# Patient Record
Sex: Female | Born: 2000 | Race: Black or African American | Hispanic: No | Marital: Single | Smoking: Current some day smoker
Health system: Southern US, Community
[De-identification: ages and names within clinical notes are randomized; demographics above are authoritative.]

## PROBLEM LIST (undated history)

## (undated) DIAGNOSIS — E282 Polycystic ovarian syndrome: Secondary | ICD-10-CM

## (undated) DIAGNOSIS — F32A Depression, unspecified: Secondary | ICD-10-CM

## (undated) DIAGNOSIS — R519 Headache, unspecified: Secondary | ICD-10-CM

## (undated) DIAGNOSIS — F419 Anxiety disorder, unspecified: Secondary | ICD-10-CM

## (undated) DIAGNOSIS — R51 Headache: Secondary | ICD-10-CM

## (undated) HISTORY — PX: ADENOIDECTOMY: SHX5191

## (undated) HISTORY — DX: Headache, unspecified: R51.9

## (undated) HISTORY — DX: Anxiety disorder, unspecified: F41.9

## (undated) HISTORY — PX: TONSILLECTOMY: SUR1361

## (undated) HISTORY — DX: Depression, unspecified: F32.A

## (undated) HISTORY — DX: Headache: R51

---
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
## 2022-01-06 ENCOUNTER — Encounter: Payer: Self-pay | Admitting: *Deleted

## 2022-01-20 NOTE — L&D Delivery Note (Signed)
OB/GYN Faculty Practice Delivery Note  Alyssa Mcdaniel is a 21 y.o. G2P0101 s/p SVD at [redacted]w[redacted]d. She was admitted for SOL.   ROM: 4h 15m with meconium stained fluid (moderate) GBS Status: Negative Maximum Maternal Temperature: 98.2  Labor Progress: Pt arrived in active labor and progressed without augmentation to complete. Did require careful expectant management due to recurrent decelerations once her water broke. Delivery call to request NICU at bedside for delivery.  Delivery Date/Time: 05/31/22 at 1321 Delivery: Called to room and patient was complete with strong urge to push. Head delivered LOP. Nuchal cord and anterior hand stuck behind fetal back. Shoulder and body delivered in usual fashion. Infant suctioned then had spontaneous cry, placed on mother's abdomen, dried and stimulated. NICU team able to exit. Cord clamped x 2 after 3-minute delay, and cut by [name]. Cord blood collected but clotted before it could be drawn into tubes. Cord sample collected. Placenta delivered spontaneously, intact, with 3-vessel cord. Fundus firm with massage and Pitocin. Labia, perineum, vagina, and cervix inspected, bilateral labial lacerations found and repaired with 3.0 vicryl.   Placenta: spontaneous, intact sent to L&D for disposal Complications: none Lacerations: bilateral labial EBL: 307 Analgesia: epidural  Postpartum Planning [x]  transfer orders to MB [x]  discharge summary started & shared [x]  message to sent to schedule follow-up  [x]  lists updated [x]  vaccines UTD  Infant: Boy(yes)  APGARs 9/9  [id number]  Edd Arbour, CNM, IBCLC Certified Nurse Midwife, [facility] for [employer], [facility] 05/31/2022, 2:57 PM

## 2022-01-21 ENCOUNTER — Ambulatory Visit: Payer: Medicaid Other | Admitting: *Deleted

## 2022-01-21 ENCOUNTER — Ambulatory Visit: Payer: Medicaid Other | Attending: Obstetrics and Gynecology

## 2022-01-21 ENCOUNTER — Encounter: Payer: Self-pay | Admitting: *Deleted

## 2022-01-21 ENCOUNTER — Other Ambulatory Visit: Payer: Self-pay | Admitting: *Deleted

## 2022-01-21 VITALS — BP 115/61 | HR 93

## 2022-01-21 DIAGNOSIS — Z98891 History of uterine scar from previous surgery: Secondary | ICD-10-CM

## 2022-01-21 DIAGNOSIS — O09892 Supervision of other high risk pregnancies, second trimester: Secondary | ICD-10-CM

## 2022-01-21 DIAGNOSIS — O99212 Obesity complicating pregnancy, second trimester: Secondary | ICD-10-CM | POA: Diagnosis not present

## 2022-01-21 DIAGNOSIS — Z8751 Personal history of pre-term labor: Secondary | ICD-10-CM | POA: Diagnosis present

## 2022-01-21 DIAGNOSIS — O0990 Supervision of high risk pregnancy, unspecified, unspecified trimester: Secondary | ICD-10-CM | POA: Diagnosis present

## 2022-01-21 DIAGNOSIS — E669 Obesity, unspecified: Secondary | ICD-10-CM

## 2022-01-21 DIAGNOSIS — O09212 Supervision of pregnancy with history of pre-term labor, second trimester: Secondary | ICD-10-CM

## 2022-01-21 DIAGNOSIS — O34219 Maternal care for unspecified type scar from previous cesarean delivery: Secondary | ICD-10-CM

## 2022-01-21 DIAGNOSIS — Z3A19 19 weeks gestation of pregnancy: Secondary | ICD-10-CM

## 2022-01-21 DIAGNOSIS — Z363 Encounter for antenatal screening for malformations: Secondary | ICD-10-CM

## 2022-01-31 ENCOUNTER — Telehealth (INDEPENDENT_AMBULATORY_CARE_PROVIDER_SITE_OTHER): Payer: Medicaid Other | Admitting: Medical

## 2022-01-31 ENCOUNTER — Encounter: Payer: Self-pay | Admitting: Medical

## 2022-01-31 VITALS — Wt 215.0 lb

## 2022-01-31 DIAGNOSIS — Z8751 Personal history of pre-term labor: Secondary | ICD-10-CM

## 2022-01-31 DIAGNOSIS — Z3A21 21 weeks gestation of pregnancy: Secondary | ICD-10-CM

## 2022-01-31 DIAGNOSIS — O418X10 Other specified disorders of amniotic fluid and membranes, first trimester, not applicable or unspecified: Secondary | ICD-10-CM

## 2022-01-31 DIAGNOSIS — O34219 Maternal care for unspecified type scar from previous cesarean delivery: Secondary | ICD-10-CM

## 2022-01-31 DIAGNOSIS — O0990 Supervision of high risk pregnancy, unspecified, unspecified trimester: Secondary | ICD-10-CM

## 2022-01-31 DIAGNOSIS — O09212 Supervision of pregnancy with history of pre-term labor, second trimester: Secondary | ICD-10-CM

## 2022-01-31 DIAGNOSIS — Z98891 History of uterine scar from previous surgery: Secondary | ICD-10-CM

## 2022-01-31 DIAGNOSIS — O418X20 Other specified disorders of amniotic fluid and membranes, second trimester, not applicable or unspecified: Secondary | ICD-10-CM

## 2022-01-31 NOTE — Progress Notes (Signed)
I connected with Alyssa Mcdaniel 01/31/22 at 11:15 AM EST by: MyChart video and verified that I am speaking with the correct person using two identifiers.  Patient is located at home and provider is located at [facility].     I discussed the limitations, risks, security and privacy concerns of performing an evaluation and management service by MyChart video and the availability of in person appointments. I also discussed with the patient that there may be a patient responsible charge related to this service. By engaging in this virtual visit, you consent to the provision of healthcare.  Additionally, you authorize for your insurance to be billed for the services provided during this visit.  The patient expressed understanding and agreed to proceed.  The following staff members participated in the virtual visit:  Madalyn Rob, RN     PRENATAL VISIT NOTE  Subjective:  Alyssa Mcdaniel is a 22 y.o. G2P0101 at [redacted]w[redacted]d  for virtual visit for ongoing prenatal care.  She is currently monitored for the following issues for this high-risk pregnancy and has Migraine without aura and without status migrainosus, not intractable; Depression; Asthma; Subchorionic hematoma in first trimester; Supervision of high risk pregnancy, antepartum; History of cesarean section; and history of 27 weeks delivery on their problem list.  Patient reports headache and URI symptoms x 3 days, intermittent round ligament pain .  Contractions: Not present. Vag. Bleeding: None.  Movement: Present. Denies leaking of fluid.   The following portions of the patient's history were reviewed and updated as appropriate: allergies, current medications, past family history, past medical history, past social history, past surgical history and problem list.   Objective:   Vitals:   01/31/22 1112  Weight: 215 lb (97.5 kg)   Self-Obtained  Fetal Status:     Movement: Present     Assessment and Plan:  Pregnancy: G2P0101 at [redacted]w[redacted]d 1. Supervision of  high risk pregnancy, antepartum - Repeat US 02/18/22  2. History of cesarean section - Desires TOLAC, consent at 28 weeks   3. Subchorionic hematoma in first trimester, single or unspecified fetus  4. history of 27 weeks delivery - With twin pregnancy  5. Round ligament pain   6. Viral upper respiratory infection   - List of OTC medications given, advised rest and hydration  - Discussed warning signs for worsening condition that would warrant sooner follow-up or Antibiotics   7. [redacted] weeks gestation of pregnancy  Preterm labor symptoms and general obstetric precautions including but not limited to vaginal bleeding, contractions, leaking of fluid and fetal movement were reviewed in detail with the patient.  Return in about 4 weeks (around 02/28/2022) for LOB, In-Person, any provider.  Future Appointments  Date Time Provider [place]  02/18/2022  1:15 PM [facility] NURSE [facility]  02/18/2022  1:30 PM [facility] US2 [facility] [place]     Time spent on virtual visit: 10 minutes  Kerry Hough, PA-C

## 2022-01-31 NOTE — Patient Instructions (Signed)

## 2022-02-09 ENCOUNTER — Encounter (HOSPITAL_COMMUNITY): Payer: Self-pay | Admitting: Family Medicine

## 2022-02-09 ENCOUNTER — Inpatient Hospital Stay (HOSPITAL_COMMUNITY)
Admission: AD | Admit: 2022-02-09 | Discharge: 2022-02-09 | Disposition: A | Discharge status: Home / Self Care | Payer: Medicaid Other | Attending: Family Medicine | Admitting: Family Medicine

## 2022-02-09 DIAGNOSIS — O26899 Other specified pregnancy related conditions, unspecified trimester: Secondary | ICD-10-CM

## 2022-02-09 DIAGNOSIS — Z3A22 22 weeks gestation of pregnancy: Secondary | ICD-10-CM

## 2022-02-09 DIAGNOSIS — O09899 Supervision of other high risk pregnancies, unspecified trimester: Secondary | ICD-10-CM

## 2022-02-09 DIAGNOSIS — O26892 Other specified pregnancy related conditions, second trimester: Secondary | ICD-10-CM | POA: Diagnosis not present

## 2022-02-09 DIAGNOSIS — O09212 Supervision of pregnancy with history of pre-term labor, second trimester: Secondary | ICD-10-CM | POA: Diagnosis not present

## 2022-02-09 DIAGNOSIS — R102 Pelvic and perineal pain: Secondary | ICD-10-CM | POA: Diagnosis not present

## 2022-02-09 DIAGNOSIS — O99891 Other specified diseases and conditions complicating pregnancy: Secondary | ICD-10-CM | POA: Insufficient documentation

## 2022-02-09 LAB — URINALYSIS, ROUTINE W REFLEX MICROSCOPIC
Bilirubin Urine: NEGATIVE
Glucose, UA: NEGATIVE mg/dL
Hgb urine dipstick: NEGATIVE
Ketones, ur: NEGATIVE mg/dL
Leukocytes,Ua: NEGATIVE
Nitrite: NEGATIVE
Protein, ur: NEGATIVE mg/dL
Specific Gravity, Urine: 1.01 (ref 1.005–1.030)
pH: 7 (ref 5.0–8.0)

## 2022-02-09 LAB — WET PREP, GENITAL
Clue Cells Wet Prep HPF POC: NONE SEEN
Sperm: NONE SEEN
Trich, Wet Prep: NONE SEEN
WBC, Wet Prep HPF POC: <10 (ref <10)
Yeast Wet Prep HPF POC: NONE SEEN

## 2022-02-09 NOTE — Discharge Instructions (Signed)

## 2022-02-09 NOTE — MAU Provider Note (Signed)
History     CSN: [id number]  Arrival date and time: 02/09/22 2725   Event Date/Time   First Provider Initiated Contact with Patient 02/09/22 2104      Chief Complaint  Patient presents with   Abdominal Pain   Hip Pain   HPI  Alyssa Mcdaniel is a 22 y.o. G2P0101 at [redacted]w[redacted]d who presents for evaluation of left lower abdominal pain. Patient reports it has resolved at this time. When it occurred, she reports it lasted for longer than round ligament pain usually does. She states she went and had a bowel movement which decreased the severity of the pain but it was still present. She states the pain resolved without medication. She denies any pain at this time.  She denies any vaginal bleeding, discharge, and leaking of fluid. Denies any constipation, diarrhea or any urinary complaints. Reports normal fetal movement.  She is understandably nervous because she feels like these are similar symptoms to what she had before she delivered her twins.    OB History     Gravida  2   Para  1   Term  0   Preterm  1   AB  0   Living  1      SAB  0   IAB      Ectopic  0   Multiple  1   Live Births  2        Obstetric Comments  Twin A delivered @ Home          Past Medical History:  Diagnosis Date   Asthma    Depression    Headache    PCOS (polycystic ovarian syndrome)    Sleeping difficulty 02/24/2018   Superficial thrombophlebitis in puerperium 03/15/2021   Tension headache 02/24/2018    Past Surgical History:  Procedure Laterality Date   ADENOIDECTOMY     CESAREAN SECTION MULTI-GESTATIONAL N/A 03/10/2021   Procedure: CESAREAN SECTION MULTI-GESTATIONAL;  Surgeon: Tereso Newcomer, MD;  Location: [facility];  Service: Obstetrics;  Laterality: N/A;   TONSILLECTOMY      Family History  Problem Relation Age of Onset   Hypertension Mother    Anxiety disorder Mother    Depression Mother    Hypertension Maternal Aunt    Anxiety disorder Maternal Grandmother     Depression Maternal Grandmother    Breast cancer Maternal Grandmother 65   Anxiety disorder Maternal Grandfather    Depression Maternal Grandfather    Diabetes Other    Cancer Other    Hypertension Other    Migraines Neg Hx    Seizures Neg Hx    Autism Neg Hx    ADD / ADHD Neg Hx    Bipolar disorder Neg Hx    Schizophrenia Neg Hx     Social History   Tobacco Use   Smoking status: Never    Passive exposure: Yes   Smokeless tobacco: Never  Vaping Use   Vaping Use: Never used  Substance Use Topics   Alcohol use: No   Drug use: Not Currently    Types: Marijuana    Comment: occasional - last smoked 11/03/21    Allergies: No Known Allergies  Medications Prior to Admission  Medication Sig Dispense Refill Last Dose   aspirin EC 81 MG tablet Take 1 tablet (81 mg total) by mouth daily. Take after 12 weeks for prevention of preeclampsia later in pregnancy 300 tablet 2 02/08/2022   Pediatric Multivit-Minerals (FLINTSTONES GUMMIES PO) Take 2 tablets by  mouth daily.   02/09/2022   sertraline (ZOLOFT) 50 MG tablet Take 1 tablet (50 mg total) by mouth daily. (Patient taking differently: Take 50 mg by mouth at bedtime. 10pm) 90 tablet 3 02/08/2022   albuterol (VENTOLIN HFA) 108 (90 Base) MCG/ACT inhaler Inhale 2 puffs into the lungs every 6 (six) hours as needed for shortness of breath. For shortness of breath 18 g 3    Blood Pressure Monitoring DEVI 1 each by Does not apply route once a week. 1 each 0    famotidine (PEPCID) 20 MG tablet Take 20 mg by mouth 2 (two) times daily.      fluticasone (FLOVENT HFA) 110 MCG/ACT inhaler Inhale 2 puffs into the lungs in the morning and at bedtime. 1 each 12    metoCLOPramide (REGLAN) 10 MG tablet Take 1 tablet (10 mg total) by mouth every 8 (eight) hours as needed for nausea or vomiting. 30 tablet 0    ondansetron (ZOFRAN-ODT) 8 MG disintegrating tablet Take 8 mg by mouth every 8 (eight) hours as needed for nausea or vomiting.      terconazole (TERAZOL  7) 0.4 % vaginal cream Place 1 applicator vaginally at bedtime. (Patient not taking: Reported on 01/31/2022) 45 g 0     Review of Systems  Constitutional: Negative.  Negative for fatigue and fever.  HENT: Negative.    Respiratory: Negative.  Negative for shortness of breath.   Cardiovascular: Negative.  Negative for chest pain.  Gastrointestinal:  Positive for abdominal pain. Negative for constipation, diarrhea, nausea and vomiting.  Genitourinary: Negative.  Negative for dysuria, vaginal bleeding and vaginal discharge.  Neurological: Negative.  Negative for dizziness and headaches.   Physical Exam   Blood pressure 122/61, pulse 79, temperature 98.7 F (37.1 C), temperature source Oral, resp. rate 18, height 5\' 4"  (1.626 m), weight 95.1 kg, last menstrual period 08/20/2021, SpO2 100 %, currently breastfeeding.  Patient Vitals for the past 24 hrs:  BP Temp Temp src Pulse Resp SpO2 Height Weight  02/09/22 2156 122/61 -- -- 79 18 100 % -- --  02/09/22 2035 117/60 -- -- 94 18 96 % -- --  02/09/22 1941 118/61 98.7 F (37.1 C) Oral (!) 102 18 100 % 5\' 4"  (1.626 m) 95.1 kg    Physical Exam Vitals and nursing note reviewed.  Constitutional:      General: She is not in acute distress.    Appearance: She is well-developed.  HENT:     Head: Normocephalic.  Eyes:     Pupils: Pupils are equal, round, and reactive to light.  Cardiovascular:     Rate and Rhythm: Normal rate and regular rhythm.     Heart sounds: Normal heart sounds.  Pulmonary:     Effort: Pulmonary effort is normal. No respiratory distress.     Breath sounds: Normal breath sounds.  Abdominal:     General: Bowel sounds are normal. There is no distension.     Palpations: Abdomen is soft.     Tenderness: There is no abdominal tenderness.  Skin:    General: Skin is warm and dry.  Neurological:     Mental Status: She is alert and oriented to person, place, and time.  Psychiatric:        Mood and Affect: Mood normal.         Behavior: Behavior normal.        Thought Content: Thought content normal.        Judgment: Judgment normal.  FHT: 148 bpm  Dilation: Closed Effacement (%): Thick Exam by:: Sharolyn Douglas, CNM   MAU Course  Procedures  Results for orders placed or performed during the hospital encounter of 02/09/22 (from the past 24 hour(s))  Urinalysis, Routine w reflex microscopic     Status: None   Collection Time: 02/09/22  7:42 PM  Result Value Ref Range   Color, Urine YELLOW YELLOW   APPearance CLEAR CLEAR   Specific Gravity, Urine 1.010 1.005 - 1.030   pH 7.0 5.0 - 8.0   Glucose, UA NEGATIVE NEGATIVE mg/dL   Hgb urine dipstick NEGATIVE NEGATIVE   Bilirubin Urine NEGATIVE NEGATIVE   Ketones, ur NEGATIVE NEGATIVE mg/dL   Protein, ur NEGATIVE NEGATIVE mg/dL   Nitrite NEGATIVE NEGATIVE   Leukocytes,Ua NEGATIVE NEGATIVE  Wet prep, genital     Status: None   Collection Time: 02/09/22  9:24 PM  Result Value Ref Range   Yeast Wet Prep HPF POC NONE SEEN NONE SEEN   Trich, Wet Prep NONE SEEN NONE SEEN   Clue Cells Wet Prep HPF POC NONE SEEN NONE SEEN   WBC, Wet Prep HPF POC <10 <10   Sperm NONE SEEN      MDM Labs ordered and reviewed.   UA Wet prep and gc/chlamydia  Assessment and Plan   1. Pain of round ligament affecting pregnancy, antepartum   2. [redacted] weeks gestation of pregnancy   3. Hx of preterm delivery, currently pregnant     -Discharge home in stable condition -Abdominal pain precautions discussed -Patient advised to follow-up with OB as scheduled this week for prenatal care -Patient may return to MAU as needed or if her condition were to change or worsen  Wende Mott, CNM 02/09/2022, 9:04 PM

## 2022-02-09 NOTE — MAU Note (Signed)
.  Alyssa Mcdaniel is a 22 y.o. at [redacted]w[redacted]d here in MAU reporting: round ligament pain on the left side that radiated down to her leg and buttocks that has been ongoing but today it got worse. Pt reports she was not able to get comfortable and was not more active today, and it was more intense prior. Pt denies taking any pain meds. Pt is very concern due to previous experience with preterm labor and del. Pt denies VB, LOF, PIH s/s.  Last intercourse yesterday   Onset of complaint:  Pain score: 4/10 Vitals:   02/09/22 1941  BP: 118/61  Pulse: (!) 102  Resp: 18  Temp: 98.7 F (37.1 C)  SpO2: 100%     FHT:148 Lab orders placed from triage:  ua

## 2022-02-10 LAB — GC/CHLAMYDIA PROBE AMP (~~LOC~~) NOT AT ARMC
Chlamydia: NEGATIVE
Comment: NEGATIVE
Comment: NORMAL
Neisseria Gonorrhea: NEGATIVE

## 2022-02-18 ENCOUNTER — Ambulatory Visit: Payer: Medicaid Other | Admitting: *Deleted

## 2022-02-18 ENCOUNTER — Other Ambulatory Visit: Payer: Self-pay

## 2022-02-18 ENCOUNTER — Encounter: Payer: Self-pay | Admitting: *Deleted

## 2022-02-18 ENCOUNTER — Ambulatory Visit: Payer: Medicaid Other | Attending: Maternal & Fetal Medicine

## 2022-02-18 VITALS — BP 128/61 | HR 83

## 2022-02-18 DIAGNOSIS — Z8751 Personal history of pre-term labor: Secondary | ICD-10-CM

## 2022-02-18 DIAGNOSIS — Z98891 History of uterine scar from previous surgery: Secondary | ICD-10-CM

## 2022-02-18 DIAGNOSIS — Z362 Encounter for other antenatal screening follow-up: Secondary | ICD-10-CM | POA: Diagnosis not present

## 2022-02-18 DIAGNOSIS — O99212 Obesity complicating pregnancy, second trimester: Secondary | ICD-10-CM | POA: Insufficient documentation

## 2022-02-18 DIAGNOSIS — E669 Obesity, unspecified: Secondary | ICD-10-CM

## 2022-02-18 DIAGNOSIS — O34219 Maternal care for unspecified type scar from previous cesarean delivery: Secondary | ICD-10-CM | POA: Insufficient documentation

## 2022-02-18 DIAGNOSIS — O09892 Supervision of other high risk pregnancies, second trimester: Secondary | ICD-10-CM | POA: Diagnosis present

## 2022-02-18 DIAGNOSIS — O0990 Supervision of high risk pregnancy, unspecified, unspecified trimester: Secondary | ICD-10-CM

## 2022-02-18 DIAGNOSIS — O09212 Supervision of pregnancy with history of pre-term labor, second trimester: Secondary | ICD-10-CM | POA: Diagnosis not present

## 2022-02-18 DIAGNOSIS — Z3A23 23 weeks gestation of pregnancy: Secondary | ICD-10-CM

## 2022-03-03 ENCOUNTER — Encounter (HOSPITAL_COMMUNITY): Payer: Self-pay | Admitting: Family Medicine

## 2022-03-03 ENCOUNTER — Inpatient Hospital Stay (HOSPITAL_COMMUNITY)
Admission: AD | Admit: 2022-03-03 | Discharge: 2022-03-03 | Disposition: A | Discharge status: Home / Self Care | Payer: Medicaid Other | Attending: Family Medicine | Admitting: Family Medicine

## 2022-03-03 DIAGNOSIS — Z3689 Encounter for other specified antenatal screening: Secondary | ICD-10-CM

## 2022-03-03 DIAGNOSIS — R109 Unspecified abdominal pain: Secondary | ICD-10-CM | POA: Diagnosis not present

## 2022-03-03 DIAGNOSIS — Z98891 History of uterine scar from previous surgery: Secondary | ICD-10-CM

## 2022-03-03 DIAGNOSIS — N898 Other specified noninflammatory disorders of vagina: Secondary | ICD-10-CM

## 2022-03-03 DIAGNOSIS — Z8751 Personal history of pre-term labor: Secondary | ICD-10-CM

## 2022-03-03 DIAGNOSIS — Z3A25 25 weeks gestation of pregnancy: Secondary | ICD-10-CM | POA: Diagnosis not present

## 2022-03-03 DIAGNOSIS — O0990 Supervision of high risk pregnancy, unspecified, unspecified trimester: Secondary | ICD-10-CM

## 2022-03-03 DIAGNOSIS — O418X10 Other specified disorders of amniotic fluid and membranes, first trimester, not applicable or unspecified: Secondary | ICD-10-CM

## 2022-03-03 DIAGNOSIS — O26892 Other specified pregnancy related conditions, second trimester: Secondary | ICD-10-CM | POA: Diagnosis not present

## 2022-03-03 DIAGNOSIS — N949 Unspecified condition associated with female genital organs and menstrual cycle: Secondary | ICD-10-CM | POA: Diagnosis not present

## 2022-03-03 LAB — URINALYSIS, ROUTINE W REFLEX MICROSCOPIC
Bilirubin Urine: NEGATIVE
Glucose, UA: NEGATIVE mg/dL
Hgb urine dipstick: NEGATIVE
Ketones, ur: NEGATIVE mg/dL
Leukocytes,Ua: NEGATIVE
Nitrite: NEGATIVE
Protein, ur: NEGATIVE mg/dL
Specific Gravity, Urine: 1.013 (ref 1.005–1.030)
pH: 7 (ref 5.0–8.0)

## 2022-03-03 LAB — WET PREP, GENITAL
Clue Cells Wet Prep HPF POC: NONE SEEN
Sperm: NONE SEEN
Trich, Wet Prep: NONE SEEN
WBC, Wet Prep HPF POC: <10 (ref <10)
Yeast Wet Prep HPF POC: NONE SEEN

## 2022-03-03 NOTE — MAU Provider Note (Signed)
History     CSN: [id number]  Arrival date and time: 03/03/22 1532   Event Date/Time   First Provider Initiated Contact with Patient 03/03/22 1610      Chief Complaint  Patient presents with   Vaginal Discharge   HPI  Alyssa Mcdaniel is a 22 year old G2P0101 at 50w5dgestation presenting with abdominal pain and one episode of mucus discharge. States she felt cramping on an off all night that resolved around 9AM. States she had one instance of yellow-green discharge around 12PM. Denies further episodes of abdominal pain or discharge. Denies VB, LOF, dysuria. Reports good FM. Last BM today. Relates she is concerned because she has a prior preterm pregnancy around this gestation.  Recent testing for STI and wet prep on 02/09/2022 negative.   OB History     Gravida  2   Para  1   Term  0   Preterm  1   AB  0   Living  1      SAB  0   IAB      Ectopic  0   Multiple  1   Live Births  2        Obstetric Comments  Twin A delivered @ Home          Past Medical History:  Diagnosis Date   Asthma    Depression    Headache    PCOS (polycystic ovarian syndrome)    Sleeping difficulty 02/24/2018   Superficial thrombophlebitis in puerperium 03/15/2021   Tension headache 02/24/2018    Past Surgical History:  Procedure Laterality Date   ADENOIDECTOMY     CESAREAN SECTION MULTI-GESTATIONAL N/A 03/10/2021   Procedure: CESAREAN SECTION MULTI-GESTATIONAL;  Surgeon: AOsborne Oman MD;  Location: [facility] LD [facility]S;  Service: Obstetrics;  Laterality: N/A;   TONSILLECTOMY      Family History  Problem Relation Age of Onset   Hypertension Mother    Anxiety disorder Mother    Depression Mother    Hypertension Maternal Aunt    Anxiety disorder Maternal Grandmother    Depression Maternal Grandmother    Breast cancer Maternal Grandmother 424  Anxiety disorder Maternal Grandfather    Depression Maternal Grandfather    Diabetes Other    Cancer Other    Hypertension Other     Migraines Neg Hx    Seizures Neg Hx    Autism Neg Hx    ADD / ADHD Neg Hx    Bipolar disorder Neg Hx    Schizophrenia Neg Hx     Social History   Tobacco Use   Smoking status: Never    Passive exposure: Yes   Smokeless tobacco: Never  Vaping Use   Vaping Use: Never used  Substance Use Topics   Alcohol use: No   Drug use: Not Currently    Types: Marijuana    Comment: occasional - last smoked 11/03/21    Allergies: No Known Allergies  Medications Prior to Admission  Medication Sig Dispense Refill Last Dose   aspirin EC 81 MG tablet Take 1 tablet (81 mg total) by mouth daily. Take after 12 weeks for prevention of preeclampsia later in pregnancy 300 tablet 2 03/02/2022   famotidine (PEPCID) 20 MG tablet Take 20 mg by mouth 2 (two) times daily.   Past Week   Pediatric Multivit-Minerals (FLINTSTONES GUMMIES PO) Take 2 tablets by mouth daily.   03/03/2022   sertraline (ZOLOFT) 50 MG tablet Take 1 tablet (50 mg total) by  mouth daily. (Patient taking differently: Take 50 mg by mouth at bedtime. 10pm) 90 tablet 3 03/03/2022   albuterol (VENTOLIN HFA) 108 (90 Base) MCG/ACT inhaler Inhale 2 puffs into the lungs every 6 (six) hours as needed for shortness of breath. For shortness of breath 18 g 3 More than a month   Blood Pressure Monitoring DEVI 1 each by Does not apply route once a week. 1 each 0    fluticasone (FLOVENT HFA) 110 MCG/ACT inhaler Inhale 2 puffs into the lungs in the morning and at bedtime. 1 each 12    metoCLOPramide (REGLAN) 10 MG tablet Take 1 tablet (10 mg total) by mouth every 8 (eight) hours as needed for nausea or vomiting. (Patient not taking: Reported on 03/03/2022) 30 tablet 0 Not Taking   ondansetron (ZOFRAN-ODT) 8 MG disintegrating tablet Take 8 mg by mouth every 8 (eight) hours as needed for nausea or vomiting.      terconazole (TERAZOL 7) 0.4 % vaginal cream Place 1 applicator vaginally at bedtime. (Patient not taking: Reported on 01/31/2022) 45 g 0     Review of  Systems General: Negative for fever and fatigue. CV: Negative for chest pain Resp: Negative for SOB GU: Positive for vaginal discharge  Physical Exam   Blood pressure 94/79, pulse 94, temperature 98.7 F (37.1 C), resp. rate 18, last menstrual period 08/20/2021, currently breastfeeding.  Physical Exam General: Well-appearing. Alert. NAD HEENT: Normocephalic. White sclera. No rhinorrhea or congestion CV: RRR without murmur Pulm: CTAB. Normal WOB on RA. No wheezing Abdomen: Gravid, non-tender, non-distended. Ext: Well perfused. No peripheral edema Skin: Warm, dry.  MAU Course  Procedures  MDM 1630: Abdominal pain most likely round ligament pain now has resolved. STI and wet prep ordered.  1800: Wet prep negative. Fetal monitoring reassuring. Vaginal discharge most likely physiologic. Discussed return precautions with patient and will follow up with G/C results if positive.  Fetal Monitoring: Baseline: 145 Variability: Moderate Accelerations: 10x10 Decelerations: None Contractions: None  Assessment and Plan   Normal [redacted] week gestation pregnancy Vaginal discharge Abdominal pain, resolved  Discharge home with return precautions F/u G/C results if positive   Colletta [place] 03/03/2022, 4:29 PM

## 2022-03-03 NOTE — Discharge Instructions (Signed)

## 2022-03-03 NOTE — MAU Note (Signed)
.  Alyssa Mcdaniel is a 22 y.o. at [redacted]w[redacted]d here in MAU reporting: she was having cramping in her back and lower pelvis off an on last night. Cramping has stopped but when she went to BR today and she wiped she had a yeloow-clear mucus that came out. No gushes of fluid or bleeding . Good fetal movement felt. No cramping today.  Onset of complaint: last night Pain score: 0 Vitals:   03/03/22 1555  BP: 94/79  Pulse: 94  Resp: 18  Temp: 98.7 F (37.1 C)     FHT:150 Lab orders placed from triage:  U/A

## 2022-03-04 LAB — GC/CHLAMYDIA PROBE AMP (~~LOC~~) NOT AT ARMC
Chlamydia: NEGATIVE
Comment: NEGATIVE
Comment: NORMAL
Neisseria Gonorrhea: NEGATIVE

## 2022-03-07 ENCOUNTER — Encounter: Payer: Medicaid Other | Admitting: Obstetrics & Gynecology

## 2022-03-17 ENCOUNTER — Ambulatory Visit (INDEPENDENT_AMBULATORY_CARE_PROVIDER_SITE_OTHER): Payer: Medicaid Other | Admitting: Advanced Practice Midwife

## 2022-03-17 ENCOUNTER — Other Ambulatory Visit: Payer: Self-pay

## 2022-03-17 VITALS — BP 110/66 | HR 82 | Wt 215.6 lb

## 2022-03-17 DIAGNOSIS — Z3A27 27 weeks gestation of pregnancy: Secondary | ICD-10-CM

## 2022-03-17 DIAGNOSIS — O0990 Supervision of high risk pregnancy, unspecified, unspecified trimester: Secondary | ICD-10-CM

## 2022-03-17 DIAGNOSIS — O0993 Supervision of high risk pregnancy, unspecified, third trimester: Secondary | ICD-10-CM

## 2022-03-17 DIAGNOSIS — Z8751 Personal history of pre-term labor: Secondary | ICD-10-CM

## 2022-03-17 DIAGNOSIS — O0992 Supervision of high risk pregnancy, unspecified, second trimester: Secondary | ICD-10-CM

## 2022-03-17 DIAGNOSIS — F5089 Other specified eating disorder: Secondary | ICD-10-CM

## 2022-03-17 DIAGNOSIS — O99013 Anemia complicating pregnancy, third trimester: Secondary | ICD-10-CM

## 2022-03-17 DIAGNOSIS — Z98891 History of uterine scar from previous surgery: Secondary | ICD-10-CM

## 2022-03-17 MED ORDER — PRENATAL VITAMINS 28-0.8 MG PO TABS: 1.0000 | ORAL_TABLET | Freq: Every day | ORAL | 10 refills | Status: DC

## 2022-03-17 NOTE — Progress Notes (Signed)
Pt reports craving & eating alot of cornstarch lately.

## 2022-03-17 NOTE — Progress Notes (Signed)
   PRENATAL VISIT NOTE  Subjective:  Alyssa Mcdaniel is a 22 y.o. G2P0101 at 23w5dbeing seen today for ongoing prenatal care.  She is currently monitored for the following issues for this high-risk pregnancy and has Migraine without aura and without status migrainosus, not intractable; Depression; Asthma; Subchorionic hematoma in first trimester; Supervision of high risk pregnancy, antepartum; History of cesarean section; and history of 27 weeks delivery on their problem list.  Patient reports no complaints.  Contractions: Not present. Vag. Bleeding: None.  Movement: Present. Denies leaking of fluid.   The following portions of the patient's history were reviewed and updated as appropriate: allergies, current medications, past family history, past medical history, past social history, past surgical history and problem list.   Objective:   Vitals:   03/17/22 1329  BP: 110/66  Pulse: 82  Weight: 215 lb 9.6 oz (97.8 kg)    Fetal Status: Fetal Heart Rate (bpm): 147 Fundal Height: 27 cm Movement: Present     General:  Alert, oriented and cooperative. Patient is in no acute distress.  Skin: Skin is warm and dry. No rash noted.   Cardiovascular: Normal heart rate noted  Respiratory: Normal respiratory effort, no problems with respiration noted  Abdomen: Soft, gravid, appropriate for gestational age.  Pain/Pressure: Present     Pelvic: Cervical exam deferred        Extremities: Normal range of motion.  Edema: None  Mental Status: Normal mood and affect. Normal behavior. Normal judgment and thought content.   Assessment and Plan:  Pregnancy: G2P0101 at 226w5d. Supervision of high risk pregnancy, antepartum --Anticipatory guidance about next visits/weeks of pregnancy given.   - CBC; Future - HIV Antibody (routine testing w rflx); Future - RPR; Future - Glucose Tolerance, 2 Hours w/1 Hour; Future  2. History of cesarean section --Desires VBAC, to see MD at next appt for VBAC  consent --Hx cesarean at 27 weeks for twins pregnancy for abruption/malpresentation  3. history of 27 weeks delivery   4. Pica --Craving cornstarch. Mostly the texture so puts in her mouth but spits it out after chewing.   --Hgb 10.7 on 12/02/21 --Discussed likely anemia, pt will switch from Flintstones to PNV with iron today, increase iron rich foods in diet, 28 week labs ordered --Pt denies any cravings for nonfoods or other unsafe substances.  Pt to notify office right away if she craves anything not safe to eat.   5. [redacted] weeks gestation of pregnancy    Preterm labor symptoms and general obstetric precautions including but not limited to vaginal bleeding, contractions, leaking of fluid and fetal movement were reviewed in detail with the patient. Please refer to After Visit Summary for other counseling recommendations.   Return in about 2 weeks (around 03/31/2022) for MD only, VBAC consent.  Future Appointments  Date Time Provider [place]/04/2022  8:20 AM [facility]/07/2022  1:15 PM [facility] NURSE [facility]/07/2022  1:30 PM [facility] US3 [facility]3/14/2024 10:55 AM PiAletha HalimMD [facility]  LiFatima BlankCNM

## 2022-03-24 ENCOUNTER — Other Ambulatory Visit: Payer: Self-pay

## 2022-03-24 ENCOUNTER — Other Ambulatory Visit: Payer: Medicaid Other

## 2022-03-24 DIAGNOSIS — O0990 Supervision of high risk pregnancy, unspecified, unspecified trimester: Secondary | ICD-10-CM

## 2022-03-25 LAB — CBC
Hematocrit: 30.7 % — ABNORMAL LOW (ref 34.0–46.6)
Hemoglobin: 10 g/dL — ABNORMAL LOW (ref 11.1–15.9)
MCH: 27.8 pg (ref 26.6–33.0)
MCHC: 32.6 g/dL (ref 31.5–35.7)
MCV: 85 fL (ref 79–97)
Platelets: 414 10*3/uL (ref 150–450)
RBC: 3.6 x10E6/uL — ABNORMAL LOW (ref 3.77–5.28)
RDW: 13.2 % (ref 11.7–15.4)
WBC: 11.3 10*3/uL — ABNORMAL HIGH (ref 3.4–10.8)

## 2022-03-25 LAB — RPR: RPR Ser Ql: NONREACTIVE

## 2022-03-25 LAB — HIV ANTIBODY (ROUTINE TESTING W REFLEX): HIV Screen 4th Generation wRfx: NONREACTIVE

## 2022-03-25 LAB — GLUCOSE TOLERANCE, 2 HOURS W/ 1HR
Glucose, 1 hour: 112 mg/dL (ref 70–179)
Glucose, 2 hour: 129 mg/dL (ref 70–152)
Glucose, Fasting: 85 mg/dL (ref 70–91)

## 2022-03-27 ENCOUNTER — Ambulatory Visit: Payer: Medicaid Other | Attending: Maternal & Fetal Medicine

## 2022-03-27 ENCOUNTER — Other Ambulatory Visit: Payer: Self-pay

## 2022-03-27 ENCOUNTER — Ambulatory Visit: Payer: Medicaid Other

## 2022-03-27 VITALS — BP 113/53 | HR 86

## 2022-03-27 DIAGNOSIS — O09213 Supervision of pregnancy with history of pre-term labor, third trimester: Secondary | ICD-10-CM

## 2022-03-27 DIAGNOSIS — O0990 Supervision of high risk pregnancy, unspecified, unspecified trimester: Secondary | ICD-10-CM | POA: Diagnosis present

## 2022-03-27 DIAGNOSIS — Z8751 Personal history of pre-term labor: Secondary | ICD-10-CM

## 2022-03-27 DIAGNOSIS — O468X1 Other antepartum hemorrhage, first trimester: Secondary | ICD-10-CM | POA: Diagnosis present

## 2022-03-27 DIAGNOSIS — Z98891 History of uterine scar from previous surgery: Secondary | ICD-10-CM

## 2022-03-27 DIAGNOSIS — O99213 Obesity complicating pregnancy, third trimester: Secondary | ICD-10-CM | POA: Diagnosis not present

## 2022-03-27 DIAGNOSIS — O418X10 Other specified disorders of amniotic fluid and membranes, first trimester, not applicable or unspecified: Secondary | ICD-10-CM | POA: Diagnosis present

## 2022-03-27 DIAGNOSIS — O09893 Supervision of other high risk pregnancies, third trimester: Secondary | ICD-10-CM

## 2022-03-27 DIAGNOSIS — E669 Obesity, unspecified: Secondary | ICD-10-CM

## 2022-03-27 DIAGNOSIS — O34219 Maternal care for unspecified type scar from previous cesarean delivery: Secondary | ICD-10-CM

## 2022-03-27 DIAGNOSIS — O09293 Supervision of pregnancy with other poor reproductive or obstetric history, third trimester: Secondary | ICD-10-CM | POA: Diagnosis not present

## 2022-03-27 DIAGNOSIS — Z3A29 29 weeks gestation of pregnancy: Secondary | ICD-10-CM

## 2022-04-01 DIAGNOSIS — O99013 Anemia complicating pregnancy, third trimester: Secondary | ICD-10-CM | POA: Insufficient documentation

## 2022-04-01 MED ORDER — FERROUS SULFATE 325 (65 FE) MG PO TBEC: 325.0000 mg | DELAYED_RELEASE_TABLET | ORAL | 3 refills | Status: DC

## 2022-04-01 NOTE — Addendum Note (Signed)
Addended by: Fatima Blank A on: 04/01/2022 02:12 PM   Modules accepted: Orders

## 2022-04-03 ENCOUNTER — Ambulatory Visit (INDEPENDENT_AMBULATORY_CARE_PROVIDER_SITE_OTHER): Payer: Medicaid Other | Admitting: Advanced Practice Midwife

## 2022-04-03 ENCOUNTER — Other Ambulatory Visit: Payer: Self-pay

## 2022-04-03 VITALS — BP 111/81 | HR 94 | Wt 220.4 lb

## 2022-04-03 DIAGNOSIS — Z3A30 30 weeks gestation of pregnancy: Secondary | ICD-10-CM

## 2022-04-03 DIAGNOSIS — Z98891 History of uterine scar from previous surgery: Secondary | ICD-10-CM

## 2022-04-03 DIAGNOSIS — Z8751 Personal history of pre-term labor: Secondary | ICD-10-CM

## 2022-04-03 DIAGNOSIS — O0993 Supervision of high risk pregnancy, unspecified, third trimester: Secondary | ICD-10-CM

## 2022-04-03 DIAGNOSIS — O0990 Supervision of high risk pregnancy, unspecified, unspecified trimester: Secondary | ICD-10-CM

## 2022-04-03 NOTE — Progress Notes (Signed)
I came and saw patient and reviewed her chart regarding TOLAC.  Patient had combined delivery with vaginal delivery of twin A followed by stat primary LTCS of twin B due to placental abruption. Op note reviewed, she is a good TOLAC candidate.  Reviewed risks of uterine rupture of about 1%, increased risks for baby for severe neurological outcomes or death of about 01/998 infants who experience a rupture and the same risks for mom of about 1/10,000 women. Stressed that VBAC is not guaranteed but has the best long term outcomes for both of them.  TOLAC consent signed.   Clarnce Flock, MD/MPH Attending Family Medicine Physician, [facility] for [facility] [place]  11:41 AM 04/03/22

## 2022-04-03 NOTE — Progress Notes (Signed)
   PRENATAL VISIT NOTE  Subjective:  Alyssa Mcdaniel is a 22 y.o. G2P0101 at [redacted]w[redacted]d being seen today for ongoing prenatal care.  She is currently monitored for the following issues for this high-risk pregnancy and has Migraine without aura and without status migrainosus, not intractable; Depression; Asthma; Subchorionic hematoma in first trimester; Supervision of high risk pregnancy, antepartum; History of cesarean section; history of 27 weeks delivery; and Anemia affecting pregnancy in third trimester on their problem list.  Patient reports no complaints.  Contractions: Not present. Vag. Bleeding: None.  Movement: Present. Denies leaking of fluid.   The following portions of the patient's history were reviewed and updated as appropriate: allergies, current medications, past family history, past medical history, past social history, past surgical history and problem list.   Objective:   Vitals:   04/03/22 1118  BP: 111/81  Pulse: 94  Weight: 220 lb 6.4 oz (100 kg)    Fetal Status: Fetal Heart Rate (bpm): 156 Fundal Height: 32 cm Movement: Present     General:  Alert, oriented and cooperative. Patient is in no acute distress.  Skin: Skin is warm and dry. No rash noted.   Cardiovascular: Normal heart rate noted  Respiratory: Normal respiratory effort, no problems with respiration noted  Abdomen: Soft, gravid, appropriate for gestational age.  Pain/Pressure: Present     Pelvic: Cervical exam deferred        Extremities: Normal range of motion.     Mental Status: Normal mood and affect. Normal behavior. Normal judgment and thought content.   Assessment and Plan:  Pregnancy: G2P0101 at [redacted]w[redacted]d 1. Supervision of high risk pregnancy, antepartum --Anticipatory guidance about next visits/weeks of pregnancy given.   2. History of cesarean section --Desires TOLAC --Dr Karl Bales signed VBAC consent with pt in office today --Reviewed pt history, concerns, validated her feelings about this  pregnancy after loss  3. history of 27 weeks delivery --C/S with abruption for twins, boy and girl, then neonatal demise for boy with SIDs  4. [redacted] weeks gestation of pregnancy   Preterm labor symptoms and general obstetric precautions including but not limited to vaginal bleeding, contractions, leaking of fluid and fetal movement were reviewed in detail with the patient. Please refer to After Visit Summary for other counseling recommendations.   No follow-ups on file.  Future Appointments  Date Time Provider [place]  04/14/2022  9:55 AM Donnamae Jude, MD [facility]  05/08/2022  1:15 PM [facility] NURSE [facility]  05/08/2022  1:30 PM [facility] US2 [facility]    Fatima Blank, CNM

## 2022-04-14 ENCOUNTER — Ambulatory Visit (INDEPENDENT_AMBULATORY_CARE_PROVIDER_SITE_OTHER): Payer: Medicaid Other | Admitting: Family Medicine

## 2022-04-14 ENCOUNTER — Other Ambulatory Visit: Payer: Self-pay

## 2022-04-14 VITALS — BP 110/73 | HR 89 | Wt 220.6 lb

## 2022-04-14 DIAGNOSIS — O0993 Supervision of high risk pregnancy, unspecified, third trimester: Secondary | ICD-10-CM

## 2022-04-14 DIAGNOSIS — O0990 Supervision of high risk pregnancy, unspecified, unspecified trimester: Secondary | ICD-10-CM

## 2022-04-14 DIAGNOSIS — O418X30 Other specified disorders of amniotic fluid and membranes, third trimester, not applicable or unspecified: Secondary | ICD-10-CM

## 2022-04-14 DIAGNOSIS — Z8751 Personal history of pre-term labor: Secondary | ICD-10-CM

## 2022-04-14 DIAGNOSIS — J4540 Moderate persistent asthma, uncomplicated: Secondary | ICD-10-CM

## 2022-04-14 DIAGNOSIS — F331 Major depressive disorder, recurrent, moderate: Secondary | ICD-10-CM

## 2022-04-14 DIAGNOSIS — O468X3 Other antepartum hemorrhage, third trimester: Secondary | ICD-10-CM

## 2022-04-14 DIAGNOSIS — Z98891 History of uterine scar from previous surgery: Secondary | ICD-10-CM

## 2022-04-14 DIAGNOSIS — O418X10 Other specified disorders of amniotic fluid and membranes, first trimester, not applicable or unspecified: Secondary | ICD-10-CM

## 2022-04-14 DIAGNOSIS — Z3A31 31 weeks gestation of pregnancy: Secondary | ICD-10-CM

## 2022-04-14 NOTE — Progress Notes (Signed)
   PRENATAL VISIT NOTE  Subjective:  Alyssa Mcdaniel is a 22 y.o. G2P0101 at [redacted]w[redacted]d being seen today for ongoing prenatal care.  She is currently monitored for the following issues for this low-risk pregnancy and has Migraine without aura and without status migrainosus, not intractable; Depression; Asthma; Subchorionic hematoma in first trimester; Supervision of high risk pregnancy, antepartum; History of cesarean section; history of 27 weeks delivery; and Anemia affecting pregnancy in third trimester on their problem list.  Patient reports no complaints.  Contractions: Not present. Vag. Bleeding: None.  Movement: Present. Denies leaking of fluid.   The following portions of the patient's history were reviewed and updated as appropriate: allergies, current medications, past family history, past medical history, past social history, past surgical history and problem list.   Objective:   Vitals:   04/14/22 1026  BP: 110/73  Pulse: 89  Weight: 220 lb 9.6 oz (100.1 kg)    Fetal Status: Fetal Heart Rate (bpm): 138 Fundal Height: 33 cm Movement: Present     General:  Alert, oriented and cooperative. Patient is in no acute distress.  Skin: Skin is warm and dry. No rash noted.   Cardiovascular: Normal heart rate noted  Respiratory: Normal respiratory effort, no problems with respiration noted  Abdomen: Soft, gravid, appropriate for gestational age.  Pain/Pressure: Absent     Pelvic: Cervical exam deferred        Extremities: Normal range of motion.     Mental Status: Normal mood and affect. Normal behavior. Normal judgment and thought content.   Assessment and Plan:  Pregnancy: G2P0101 at [redacted]w[redacted]d 1. Supervision of high risk pregnancy, antepartum Continue routine prenatal care. Bedside u/s done and showed good fluid, vtx, normal movement  2. History of cesarean section Desires TOLAC--consent signed  3. history of 27 weeks delivery With twins had SVD at home and then c-section for  abruptio and breech baby B  4. Moderate episode of recurrent major depressive disorder (HCC) On Zoloft, feels like it is working well  5. Moderate persistent asthma without complication On Flovent, good control  6. Subchorionic hematoma in first trimester, single or unspecified fetus Resolved.  Preterm labor symptoms and general obstetric precautions including but not limited to vaginal bleeding, contractions, leaking of fluid and fetal movement were reviewed in detail with the patient. Please refer to After Visit Summary for other counseling recommendations.   Return in 2 weeks (on 04/28/2022) for [facility].  Future Appointments  Date Time Provider [place]  04/28/2022  2:15 PM Danielle Rankin [facility]  05/08/2022  1:15 PM [facility] NURSE [facility]  05/08/2022  1:30 PM [facility] US2 [facility]    Donnamae Jude, MD

## 2022-04-28 ENCOUNTER — Ambulatory Visit (INDEPENDENT_AMBULATORY_CARE_PROVIDER_SITE_OTHER): Payer: Medicaid Other | Admitting: Medical

## 2022-04-28 ENCOUNTER — Encounter: Payer: Self-pay | Admitting: Medical

## 2022-04-28 ENCOUNTER — Other Ambulatory Visit: Payer: Self-pay

## 2022-04-28 VITALS — BP 117/75 | HR 99 | Wt 225.5 lb

## 2022-04-28 DIAGNOSIS — F331 Major depressive disorder, recurrent, moderate: Secondary | ICD-10-CM

## 2022-04-28 DIAGNOSIS — O0993 Supervision of high risk pregnancy, unspecified, third trimester: Secondary | ICD-10-CM

## 2022-04-28 DIAGNOSIS — J4540 Moderate persistent asthma, uncomplicated: Secondary | ICD-10-CM

## 2022-04-28 DIAGNOSIS — Z98891 History of uterine scar from previous surgery: Secondary | ICD-10-CM

## 2022-04-28 DIAGNOSIS — Z23 Encounter for immunization: Secondary | ICD-10-CM

## 2022-04-28 DIAGNOSIS — O99013 Anemia complicating pregnancy, third trimester: Secondary | ICD-10-CM

## 2022-04-28 DIAGNOSIS — Z8751 Personal history of pre-term labor: Secondary | ICD-10-CM

## 2022-04-28 DIAGNOSIS — O0990 Supervision of high risk pregnancy, unspecified, unspecified trimester: Secondary | ICD-10-CM

## 2022-04-28 DIAGNOSIS — Z3A33 33 weeks gestation of pregnancy: Secondary | ICD-10-CM

## 2022-04-28 NOTE — Progress Notes (Signed)
   PRENATAL VISIT NOTE  Subjective:  Alyssa Mcdaniel is a 22 y.o. G2P0101 at [redacted]w[redacted]d being seen today for ongoing prenatal care.  She is currently monitored for the following issues for this high-risk pregnancy and has Migraine without aura and without status migrainosus, not intractable; Depression; Asthma; Subchorionic hematoma in first trimester; Supervision of high risk pregnancy, antepartum; History of cesarean section; history of 27 weeks delivery; and Anemia affecting pregnancy in third trimester on their problem list.  Patient reports no complaints.  Contractions: Not present. Vag. Bleeding: None.  Movement: Present. Denies leaking of fluid.   The following portions of the patient's history were reviewed and updated as appropriate: allergies, current medications, past family history, past medical history, past social history, past surgical history and problem list.   Objective:   Vitals:   04/28/22 1425  BP: 117/75  Pulse: 99  Weight: 225 lb 8 oz (102.3 kg)    Fetal Status: Fetal Heart Rate (bpm): 147 Fundal Height: 35 cm Movement: Present     General:  Alert, oriented and cooperative. Patient is in no acute distress.  Skin: Skin is warm and dry. No rash noted.   Cardiovascular: Normal heart rate noted  Respiratory: Normal respiratory effort, no problems with respiration noted  Abdomen: Soft, gravid, appropriate for gestational age.  Pain/Pressure: Absent     Pelvic: Cervical exam deferred        Extremities: Normal range of motion.  Edema: None  Mental Status: Normal mood and affect. Normal behavior. Normal judgment and thought content.   Assessment and Plan:  Pregnancy: G2P0101 at [redacted]w[redacted]d 1. Supervision of high risk pregnancy, antepartum - Follow-up growth [place] 4/18 - Discussed plan for MOC, desires progesterone only due to history of migraines, unsure of method, discussed options   2. History of cesarean section - Desires TOLAC, consent signed previously   3.  Moderate persistent asthma without complication - On Flovent, no recent usage   4. Moderate episode of recurrent major depressive disorder - On Zoloft, no concerns   5. Anemia affecting pregnancy in third trimester - On PO Iron  6. history of 27 weeks delivery - with Twins, no PTL with this pregnancy   7. [redacted] weeks gestation of pregnancy  Preterm labor symptoms and general obstetric precautions including but not limited to vaginal bleeding, contractions, leaking of fluid and fetal movement were reviewed in detail with the patient. Please refer to After Visit Summary for other counseling recommendations.   Return in about 2 weeks (around 05/12/2022) for [facility] APP, In-Person.  Future Appointments  Date Time Provider Department Center  05/08/2022  1:15 PM [facility] NURSE [facility]  05/08/2022  1:30 PM [facility] US2 [facility]  05/16/2022 11:15 AM Corlis Hove, NP [facility]    Vonzella Nipple, PA-C

## 2022-05-04 ENCOUNTER — Inpatient Hospital Stay (HOSPITAL_COMMUNITY)
Admission: AD | Admit: 2022-05-04 | Discharge: 2022-05-04 | Disposition: A | Discharge status: Home / Self Care | Payer: Medicaid Other | Attending: Obstetrics & Gynecology | Admitting: Obstetrics & Gynecology

## 2022-05-04 ENCOUNTER — Encounter (HOSPITAL_COMMUNITY): Payer: Self-pay | Admitting: Obstetrics & Gynecology

## 2022-05-04 DIAGNOSIS — O99283 Endocrine, nutritional and metabolic diseases complicating pregnancy, third trimester: Secondary | ICD-10-CM | POA: Diagnosis not present

## 2022-05-04 DIAGNOSIS — O26893 Other specified pregnancy related conditions, third trimester: Secondary | ICD-10-CM | POA: Insufficient documentation

## 2022-05-04 DIAGNOSIS — O99513 Diseases of the respiratory system complicating pregnancy, third trimester: Secondary | ICD-10-CM | POA: Insufficient documentation

## 2022-05-04 DIAGNOSIS — Z3A34 34 weeks gestation of pregnancy: Secondary | ICD-10-CM | POA: Diagnosis not present

## 2022-05-04 DIAGNOSIS — O99323 Drug use complicating pregnancy, third trimester: Secondary | ICD-10-CM | POA: Insufficient documentation

## 2022-05-04 DIAGNOSIS — O4703 False labor before 37 completed weeks of gestation, third trimester: Secondary | ICD-10-CM | POA: Diagnosis not present

## 2022-05-04 LAB — URINALYSIS, ROUTINE W REFLEX MICROSCOPIC
Bilirubin Urine: NEGATIVE
Glucose, UA: NEGATIVE mg/dL
Ketones, ur: NEGATIVE mg/dL
Leukocytes,Ua: NEGATIVE
Nitrite: NEGATIVE
Protein, ur: NEGATIVE mg/dL
Specific Gravity, Urine: 1.008 (ref 1.005–1.030)
pH: 5 (ref 5.0–8.0)

## 2022-05-04 MED ORDER — LACTATED RINGERS IV BOLUS
1000.0000 mL | Freq: Once | INTRAVENOUS | Status: AC
  Administered 2022-05-04: 1000 mL via INTRAVENOUS

## 2022-05-04 NOTE — MAU Provider Note (Signed)
History     CSN: [id number]  Arrival date and time: 05/04/22 1952   Event Date/Time   First Provider Initiated Contact with Patient 05/04/22 2041      No chief complaint on file.  HPI  Alyssa Mcdaniel is a 22 y.o. G2P0101 at [redacted]w[redacted]d who presents for evaluation of ***. Patient reports ***   Patient rates the pain as a ***/10 and {Has/has not:18111} tried {anything/nothing} for the pain.   She denies any {OBcomplaints:27511}. Denies any constipation, diarrhea or any urinary complaints. Reports normal fetal movement.   {GYN/OB [id number]  Past Medical History:  Diagnosis Date   Asthma    Depression    Headache    PCOS (polycystic ovarian syndrome)    Sleeping difficulty 02/24/2018   Superficial thrombophlebitis in puerperium 03/15/2021   Tension headache 02/24/2018    Past Surgical History:  Procedure Laterality Date   ADENOIDECTOMY     CESAREAN SECTION MULTI-GESTATIONAL N/A 03/10/2021   Procedure: CESAREAN SECTION MULTI-GESTATIONAL;  Surgeon: Tereso Newcomer, MD;  Location: [facility] LD [facility]S;  Service: Obstetrics;  Laterality: N/A;   TONSILLECTOMY      Family History  Problem Relation Age of Onset   Hypertension Mother    Anxiety disorder Mother    Depression Mother    Hypertension Maternal Aunt    Anxiety disorder Maternal Grandmother    Depression Maternal Grandmother    Breast cancer Maternal Grandmother 45   Anxiety disorder Maternal Grandfather    Depression Maternal Grandfather    Diabetes Other    Cancer Other    Hypertension Other    Migraines Neg Hx    Seizures Neg Hx    Autism Neg Hx    ADD / ADHD Neg Hx    Bipolar disorder Neg Hx    Schizophrenia Neg Hx     Social History   Tobacco Use   Smoking status: Never    Passive exposure: Yes   Smokeless tobacco: Never  Vaping Use   Vaping Use: Never used  Substance Use Topics   Alcohol use: No   Drug use: Not Currently    Types: Marijuana    Comment: occasional - last smoked 11/03/21     Allergies: No Known Allergies  Medications Prior to Admission  Medication Sig Dispense Refill Last Dose   albuterol (VENTOLIN HFA) 108 (90 Base) MCG/ACT inhaler Inhale 2 puffs into the lungs every 6 (six) hours as needed for shortness of breath. For shortness of breath 18 g 3    aspirin EC 81 MG tablet Take 1 tablet (81 mg total) by mouth daily. Take after 12 weeks for prevention of preeclampsia later in pregnancy 300 tablet 2    famotidine (PEPCID) 20 MG tablet Take 20 mg by mouth 2 (two) times daily.      ferrous sulfate 325 (65 FE) MG EC tablet Take 1 tablet (325 mg total) by mouth every other day. 30 tablet 3    fluticasone (FLOVENT HFA) 110 MCG/ACT inhaler Inhale 2 puffs into the lungs in the morning and at bedtime. 1 each 12    ondansetron (ZOFRAN-ODT) 8 MG disintegrating tablet Take 8 mg by mouth every 8 (eight) hours as needed for nausea or vomiting.      Prenatal Vit-Fe Fumarate-FA (PRENATAL VITAMINS) 28-0.8 MG TABS Take 1 tablet by mouth daily. 30 tablet 10    sertraline (ZOLOFT) 50 MG tablet Take 1 tablet (50 mg total) by mouth daily. 90 tablet 3     Review of Systems  Physical Exam   Blood pressure 118/64, pulse (!) 105, temperature 98.7 F (37.1 C), temperature source Oral, resp. rate 18, height  (1.626 m), weight 101.4 kg, last menstrual period 08/20/2021, SpO2 99 %, currently breastfeeding.  Patient Vitals for the past 24 hrs:  BP Temp Temp src Pulse Resp SpO2 Height Weight  05/04/22 2009 118/64 98.7 F (37.1 C) Oral (!) 105 18 99 %  (1.626 m) 101.4 kg    Physical Exam  Fetal Tracing:  Baseline: Variability: Accels: Decels:  Toco:      MAU Course  Procedures  Results for orders placed or performed during the hospital encounter of 05/04/22 (from the past 24 hour(s))  Urinalysis, Routine w reflex microscopic -Urine, Clean Catch     Status: Abnormal   Collection Time: 05/04/22  8:09 PM  Result Value Ref Range   Color, Urine YELLOW YELLOW    APPearance CLEAR CLEAR   Specific Gravity, Urine 1.008 1.005 - 1.030   pH 5.0 5.0 - 8.0   Glucose, UA NEGATIVE NEGATIVE mg/dL   Hgb urine dipstick SMALL (A) NEGATIVE   Bilirubin Urine NEGATIVE NEGATIVE   Ketones, ur NEGATIVE NEGATIVE mg/dL   Protein, ur NEGATIVE NEGATIVE mg/dL   Nitrite NEGATIVE NEGATIVE   Leukocytes,Ua NEGATIVE NEGATIVE   RBC / HPF 0-5 0 - 5 RBC/hpf   WBC, UA 0-5 0 - 5 WBC/hpf   Bacteria, UA FEW (A) NONE SEEN   Squamous Epithelial / HPF 0-5 0 - 5 /HPF   Mucus PRESENT      No results found.   MDM Labs ordered and reviewed.   UA  CNM independently reviewed the imaging ordered. Imaging show ***  CNM consulted with Dr. Marland Kitchen regarding presentation and results- MD recommends  Assessment and Plan  No diagnosis found.  -Discharge home in stable condition -Rx for *** -*** precautions discussed -Patient advised to follow-up with *** -Patient may return to MAU as needed or if her condition were to change or worsen  Rolm Bookbinder, CNM 05/04/2022, 8:41 PM

## 2022-05-04 NOTE — MAU Note (Signed)
.  Alyssa Mcdaniel is a 22 y.o. at [redacted]w[redacted]d here in MAU reporting: since around 0200 having intermittent ABD cramping and pressure and has been ongoing all day, also an increased frequency in urination. Pt endorses FM. Pt denies VB, LOF, abnormal discharge, dysuria, and complications in the pregnancy.  Last intercourse today   Onset of complaint: 0200 Pain score: 5/10 Vitals:   05/04/22 2009  BP: 118/64  Pulse: (!) 105  Resp: 18  Temp: 98.7 F (37.1 C)  SpO2: 99%     FHT:135 Lab orders placed from triage:  UA

## 2022-05-04 NOTE — Discharge Instructions (Signed)

## 2022-05-08 ENCOUNTER — Ambulatory Visit: Payer: Medicaid Other | Admitting: *Deleted

## 2022-05-08 ENCOUNTER — Ambulatory Visit: Payer: Medicaid Other | Attending: Maternal & Fetal Medicine

## 2022-05-08 VITALS — BP 124/61 | HR 64

## 2022-05-08 DIAGNOSIS — O418X10 Other specified disorders of amniotic fluid and membranes, first trimester, not applicable or unspecified: Secondary | ICD-10-CM | POA: Diagnosis present

## 2022-05-08 DIAGNOSIS — O468X1 Other antepartum hemorrhage, first trimester: Secondary | ICD-10-CM | POA: Insufficient documentation

## 2022-05-08 DIAGNOSIS — Z98891 History of uterine scar from previous surgery: Secondary | ICD-10-CM

## 2022-05-08 DIAGNOSIS — O09213 Supervision of pregnancy with history of pre-term labor, third trimester: Secondary | ICD-10-CM | POA: Diagnosis not present

## 2022-05-08 DIAGNOSIS — O34219 Maternal care for unspecified type scar from previous cesarean delivery: Secondary | ICD-10-CM

## 2022-05-08 DIAGNOSIS — O0990 Supervision of high risk pregnancy, unspecified, unspecified trimester: Secondary | ICD-10-CM | POA: Diagnosis present

## 2022-05-08 DIAGNOSIS — Z8751 Personal history of pre-term labor: Secondary | ICD-10-CM | POA: Diagnosis present

## 2022-05-08 DIAGNOSIS — O09293 Supervision of pregnancy with other poor reproductive or obstetric history, third trimester: Secondary | ICD-10-CM

## 2022-05-08 DIAGNOSIS — O09893 Supervision of other high risk pregnancies, third trimester: Secondary | ICD-10-CM | POA: Diagnosis present

## 2022-05-08 DIAGNOSIS — Z3A35 35 weeks gestation of pregnancy: Secondary | ICD-10-CM

## 2022-05-08 DIAGNOSIS — O99213 Obesity complicating pregnancy, third trimester: Secondary | ICD-10-CM | POA: Diagnosis not present

## 2022-05-08 DIAGNOSIS — E669 Obesity, unspecified: Secondary | ICD-10-CM

## 2022-05-16 ENCOUNTER — Encounter: Payer: Medicaid Other | Admitting: Student

## 2022-05-16 ENCOUNTER — Encounter: Payer: Medicaid Other | Admitting: Family Medicine

## 2022-05-19 ENCOUNTER — Ambulatory Visit (INDEPENDENT_AMBULATORY_CARE_PROVIDER_SITE_OTHER): Payer: Medicaid Other | Admitting: Obstetrics & Gynecology

## 2022-05-19 ENCOUNTER — Other Ambulatory Visit (HOSPITAL_COMMUNITY)
Admission: RE | Admit: 2022-05-19 | Discharge: 2022-05-19 | Disposition: A | Discharge status: Home / Self Care | Payer: Medicaid Other | Source: Ambulatory Visit | Attending: Student | Admitting: Student

## 2022-05-19 ENCOUNTER — Other Ambulatory Visit: Payer: Self-pay

## 2022-05-19 VITALS — BP 123/58 | HR 79 | Wt 227.0 lb

## 2022-05-19 DIAGNOSIS — O99013 Anemia complicating pregnancy, third trimester: Secondary | ICD-10-CM

## 2022-05-19 DIAGNOSIS — Z8751 Personal history of pre-term labor: Secondary | ICD-10-CM

## 2022-05-19 DIAGNOSIS — O418X30 Other specified disorders of amniotic fluid and membranes, third trimester, not applicable or unspecified: Secondary | ICD-10-CM

## 2022-05-19 DIAGNOSIS — O0990 Supervision of high risk pregnancy, unspecified, unspecified trimester: Secondary | ICD-10-CM | POA: Insufficient documentation

## 2022-05-19 DIAGNOSIS — F331 Major depressive disorder, recurrent, moderate: Secondary | ICD-10-CM

## 2022-05-19 DIAGNOSIS — Z3A36 36 weeks gestation of pregnancy: Secondary | ICD-10-CM

## 2022-05-19 DIAGNOSIS — Z98891 History of uterine scar from previous surgery: Secondary | ICD-10-CM

## 2022-05-19 DIAGNOSIS — O468X3 Other antepartum hemorrhage, third trimester: Secondary | ICD-10-CM

## 2022-05-19 DIAGNOSIS — O418X10 Other specified disorders of amniotic fluid and membranes, first trimester, not applicable or unspecified: Secondary | ICD-10-CM

## 2022-05-19 DIAGNOSIS — O0993 Supervision of high risk pregnancy, unspecified, third trimester: Secondary | ICD-10-CM

## 2022-05-19 NOTE — Progress Notes (Signed)
   PRENATAL VISIT NOTE  Subjective:  Alyssa Mcdaniel is a 22 y.o. G2P0101 at [redacted]w[redacted]d being seen today for ongoing prenatal care.  She is currently monitored for the following issues for this high-risk pregnancy and has Migraine without aura and without status migrainosus, not intractable; Depression; Asthma; Subchorionic hematoma in first trimester; Supervision of high risk pregnancy, antepartum; History of cesarean section; history of 27 weeks delivery; and Anemia affecting pregnancy in third trimester on their problem list.  Patient reports no complaints.  Contractions: Irritability. Vag. Bleeding: None.  Movement: Present. Denies leaking of fluid.   The following portions of the patient's history were reviewed and updated as appropriate: allergies, current medications, past family history, past medical history, past social history, past surgical history and problem list.   Objective:   Vitals:   05/19/22 1332 05/19/22 1355  BP: (!) 141/83 (!) 123/58  Pulse: 82 79  Weight: 227 lb (103 kg)     Fetal Status: Fetal Heart Rate (bpm): 152   Movement: Present     General:  Alert, oriented and cooperative. Patient is in no acute distress.  Skin: Skin is warm and dry. No rash noted.   Cardiovascular: Normal heart rate noted  Respiratory: Normal respiratory effort, no problems with respiration noted  Abdomen: Soft, gravid, appropriate for gestational age. Fundal height to 36 cm. Pain/Pressure: Present     Pelvic: Cervical exam deferred        Extremities: Normal range of motion.  Edema: None  Mental Status: Normal mood and affect. Normal behavior. Normal judgment and thought content.   Assessment and Plan:  Pregnancy: G2P0101 at [redacted]w[redacted]d  1. Supervision of high risk pregnancy, antepartum -Routine prenatal care -GBS, GC/C swabs today   2. History of cesarean section -Plan for TOLAC, consent signed  3. Anemia affecting pregnancy in third trimester -PO iron   4. Moderate episode of  recurrent major depressive disorder (HCC) -On Zoloft, no concerns   5. history of 27 weeks delivery -Twins previously, no PTL with this pregnancy   Term labor symptoms and general obstetric precautions including but not limited to vaginal bleeding, contractions, leaking of fluid and fetal movement were reviewed in detail with the patient. Please refer to After Visit Summary for other counseling recommendations.   Return in about 1 week (around 05/26/2022).  No future appointments.  Rosario Adie, MS3 [facility]  05/19/22 1:59 PM   Attestation of Attending Supervision of Medical Student: Evaluation and management procedures were performed by the medical student under my supervision and collaboration.  I have reviewed the student's note and chart, and I agree with the management and plan.  Scheryl Darter, MD, FACOG Attending Obstetrician & Gynecologist Faculty Practice, [facility]

## 2022-05-19 NOTE — Progress Notes (Unsigned)
R

## 2022-05-20 LAB — GC/CHLAMYDIA PROBE AMP (~~LOC~~) NOT AT ARMC
Chlamydia: NEGATIVE
Comment: NEGATIVE
Comment: NORMAL
Neisseria Gonorrhea: NEGATIVE

## 2022-05-23 LAB — CULTURE, BETA STREP (GROUP B ONLY): Strep Gp B Culture: NEGATIVE

## 2022-05-27 ENCOUNTER — Encounter: Payer: Medicaid Other | Admitting: Family Medicine

## 2022-05-27 ENCOUNTER — Encounter (HOSPITAL_COMMUNITY): Payer: Self-pay | Admitting: Obstetrics and Gynecology

## 2022-05-27 ENCOUNTER — Inpatient Hospital Stay (HOSPITAL_COMMUNITY)
Admission: AD | Admit: 2022-05-27 | Discharge: 2022-05-28 | Disposition: A | Discharge status: Home / Self Care | Payer: Medicaid Other | Attending: Obstetrics and Gynecology | Admitting: Obstetrics and Gynecology

## 2022-05-27 DIAGNOSIS — Z3A37 37 weeks gestation of pregnancy: Secondary | ICD-10-CM | POA: Insufficient documentation

## 2022-05-27 DIAGNOSIS — Z3A38 38 weeks gestation of pregnancy: Secondary | ICD-10-CM

## 2022-05-27 DIAGNOSIS — O471 False labor at or after 37 completed weeks of gestation: Secondary | ICD-10-CM

## 2022-05-27 LAB — URINALYSIS, ROUTINE W REFLEX MICROSCOPIC
Bilirubin Urine: NEGATIVE
Glucose, UA: NEGATIVE mg/dL
Hgb urine dipstick: NEGATIVE
Ketones, ur: 80 mg/dL — AB
Nitrite: NEGATIVE
Protein, ur: NEGATIVE mg/dL
Specific Gravity, Urine: 1.01 (ref 1.005–1.030)
pH: 6 (ref 5.0–8.0)

## 2022-05-27 NOTE — MAU Provider Note (Signed)
S: Alyssa Mcdaniel is a 22 y.o. G2P0101 at [redacted]w[redacted]d  who presents to MAU today complaining contractions q 2-5 minutes since 1000. She denies vaginal bleeding. She denies LOF. She reports normal fetal movement.    O: BP 125/67   Pulse 77   Temp 98.1 F (36.7 C) (Oral)   Resp 18   Ht 5\' 4"  (1.626 m)   Wt 103 kg   LMP 08/20/2021 (Approximate)   SpO2 99%   BMI 38.96 kg/m   Cervical exam:  Dilation: 2 Effacement (%): Thick Cervical Position: Posterior Station: Ballotable Presentation: Undeterminable Exam by:: Henrine Screws RN  Repeat exam: 3cm / thick/ ballotable  Fetal Monitoring: Baseline: 130bpm  Variability: moderate Accelerations: >2 15 X 15 accels Decelerations: no decels Contractions: every 2-5 mins   A: SIUP at [redacted]w[redacted]d  False labor / Early labor  P: Discharge home with labor precautions. Anticipate may return later this morning in active labor.  Alfredia Ferguson, MD 05/27/2022 10:54 PM

## 2022-05-27 NOTE — MAU Note (Signed)
.  Alyssa Mcdaniel is a 22 y.o. at [redacted]w[redacted]d here in MAU reporting: intermittent lower ABD pressure today since 1000. Pt reports being seen MAU three weeks ago for Preterm Labor, and pt states she feels this is the same. Pt reports a HA. Pt denies taking pain medications. Pt endorses FM. Pt denies VB, LOF, PIH s/s, and complications in the pregnancy.   Onset of complaint: 1000 Pain score: 3/10 ABD, 5/10 HA Vitals:   05/27/22 2156  BP: 125/74  Pulse: 78  Resp: 18  Temp: 98.1 F (36.7 C)  SpO2: 99%     FHT:130 Lab orders placed from triage:  UA

## 2022-05-28 DIAGNOSIS — Z3A38 38 weeks gestation of pregnancy: Secondary | ICD-10-CM

## 2022-05-28 DIAGNOSIS — O471 False labor at or after 37 completed weeks of gestation: Secondary | ICD-10-CM | POA: Diagnosis not present

## 2022-05-28 NOTE — MAU Note (Signed)
I have communicated with Ndulue MD  and reviewed vital signs:  Vitals:   05/28/22 0015 05/28/22 0019  BP:  134/79  Pulse:  (!) 104  Resp:    Temp:    SpO2: 99%     Vaginal exam:  Dilation: 3 Effacement (%): Thick Cervical Position: Posterior Station: Ballotable Presentation: Undeterminable Exam by:: Selena Swaminathan Sherlon Handing RN,   Also reviewed contraction pattern and that non-stress test is reactive.  It has been documented that patient is contracting every 2.5-5.5 minutes with minimal cervical change over 1.5 hours not indicating active labor.  Patient denies any other complaints.  Based on this report provider has given order for discharge.  A discharge order and diagnosis entered by a provider.   Labor discharge instructions reviewed with patient.

## 2022-05-29 ENCOUNTER — Encounter (HOSPITAL_COMMUNITY): Payer: Self-pay | Admitting: Obstetrics and Gynecology

## 2022-05-29 ENCOUNTER — Inpatient Hospital Stay (HOSPITAL_COMMUNITY)
Admission: AD | Admit: 2022-05-29 | Discharge: 2022-05-29 | Disposition: A | Discharge status: Home / Self Care | Payer: Medicaid Other | Attending: Obstetrics and Gynecology | Admitting: Obstetrics and Gynecology

## 2022-05-29 DIAGNOSIS — O471 False labor at or after 37 completed weeks of gestation: Secondary | ICD-10-CM

## 2022-05-29 DIAGNOSIS — Z3A38 38 weeks gestation of pregnancy: Secondary | ICD-10-CM | POA: Diagnosis not present

## 2022-05-29 DIAGNOSIS — Z3483 Encounter for supervision of other normal pregnancy, third trimester: Secondary | ICD-10-CM | POA: Diagnosis present

## 2022-05-29 NOTE — Progress Notes (Signed)
Alyssa Mcdaniel is a 22 y.o. G47P0101 female at [redacted]w[redacted]d  RN Labor check, not seen by provider SVE by RN: Dilation: 2 Effacement (%): Thick Station: Ballotable Exam by:: Erle Crocker, RN NST: FHR baseline 135-145 bpm, Variability: moderate, Accelerations:present, Decelerations:  Absent= Cat 1/Reactive Toco: irregular, every 5-20 minutes  D/C home  [name] [facility] 05/29/2022 7:44 AM

## 2022-05-29 NOTE — MAU Note (Signed)
Alyssa Mcdaniel is a 22 y.o. at [redacted]w[redacted]d here in MAU reporting: ctx every 3-6 minutes apart. Ctx began at 0445 today. Pt rates ctx 5/10. Pt states she feels ctx in lower back and pressure in her butt. Pt denies LOF. Pt reports brown mucous discharge. +FM   Onset of complaint: 05/29/2022 0445 Pain score: 5/10 Vitals:   05/29/22 0632 05/29/22 0633  BP: 133/77 133/77  Pulse: 80 76  Resp: 18   Temp: 98.3 F (36.8 C)   SpO2: 99%      FHT:144 Lab orders placed from triage:  mau labor

## 2022-05-31 ENCOUNTER — Encounter (HOSPITAL_COMMUNITY): Payer: Self-pay | Admitting: Obstetrics and Gynecology

## 2022-05-31 ENCOUNTER — Inpatient Hospital Stay (HOSPITAL_COMMUNITY): Payer: Medicaid Other | Admitting: Anesthesiology

## 2022-05-31 ENCOUNTER — Inpatient Hospital Stay (HOSPITAL_COMMUNITY)
Admission: AD | Admit: 2022-05-31 | Discharge: 2022-06-02 | DRG: 807 | Disposition: A | Discharge status: Home / Self Care | Payer: Medicaid Other | Attending: Obstetrics and Gynecology | Admitting: Obstetrics and Gynecology

## 2022-05-31 DIAGNOSIS — O9902 Anemia complicating childbirth: Secondary | ICD-10-CM | POA: Diagnosis present

## 2022-05-31 DIAGNOSIS — Z3A38 38 weeks gestation of pregnancy: Secondary | ICD-10-CM

## 2022-05-31 DIAGNOSIS — O34211 Maternal care for low transverse scar from previous cesarean delivery: Secondary | ICD-10-CM

## 2022-05-31 DIAGNOSIS — O99214 Obesity complicating childbirth: Secondary | ICD-10-CM | POA: Diagnosis present

## 2022-05-31 DIAGNOSIS — Z7951 Long term (current) use of inhaled steroids: Secondary | ICD-10-CM

## 2022-05-31 DIAGNOSIS — O99013 Anemia complicating pregnancy, third trimester: Secondary | ICD-10-CM | POA: Diagnosis present

## 2022-05-31 DIAGNOSIS — Z8672 Personal history of thrombophlebitis: Secondary | ICD-10-CM

## 2022-05-31 DIAGNOSIS — O26893 Other specified pregnancy related conditions, third trimester: Secondary | ICD-10-CM | POA: Diagnosis present

## 2022-05-31 DIAGNOSIS — Z8751 Personal history of pre-term labor: Secondary | ICD-10-CM

## 2022-05-31 DIAGNOSIS — Z5982 Transportation insecurity: Secondary | ICD-10-CM

## 2022-05-31 DIAGNOSIS — O0990 Supervision of high risk pregnancy, unspecified, unspecified trimester: Principal | ICD-10-CM

## 2022-05-31 DIAGNOSIS — O418X10 Other specified disorders of amniotic fluid and membranes, first trimester, not applicable or unspecified: Secondary | ICD-10-CM

## 2022-05-31 DIAGNOSIS — Z98891 History of uterine scar from previous surgery: Secondary | ICD-10-CM

## 2022-05-31 DIAGNOSIS — O34219 Maternal care for unspecified type scar from previous cesarean delivery: Secondary | ICD-10-CM | POA: Diagnosis present

## 2022-05-31 LAB — TYPE AND SCREEN
ABO/RH(D): B POS
Antibody Screen: NEGATIVE

## 2022-05-31 LAB — CBC
HCT: 34.6 % — ABNORMAL LOW (ref 36.0–46.0)
Hemoglobin: 11.5 g/dL — ABNORMAL LOW (ref 12.0–15.0)
MCH: 27.8 pg (ref 26.0–34.0)
MCHC: 33.2 g/dL (ref 30.0–36.0)
MCV: 83.6 fL (ref 80.0–100.0)
Platelets: 357 10*3/uL (ref 150–400)
RBC: 4.14 MIL/uL (ref 3.87–5.11)
RDW: 13.8 % (ref 11.5–15.5)
WBC: 7.4 10*3/uL (ref 4.0–10.5)
nRBC: 0 % (ref 0.0–0.2)

## 2022-05-31 LAB — HIV ANTIBODY (ROUTINE TESTING W REFLEX): HIV Screen 4th Generation wRfx: NONREACTIVE

## 2022-05-31 MED ORDER — BENZOCAINE-MENTHOL 20-0.5 % EX AERO
1.0000 | INHALATION_SPRAY | CUTANEOUS | Status: DC | PRN
  Administered 2022-05-31: 1 via TOPICAL
  Filled 2022-05-31: qty 56

## 2022-05-31 MED ORDER — DIPHENHYDRAMINE HCL 25 MG PO CAPS: 25.0000 mg | ORAL_CAPSULE | Freq: Four times a day (QID) | ORAL | Status: DC | PRN

## 2022-05-31 MED ORDER — TETANUS-DIPHTH-ACELL PERTUSSIS 5-2.5-18.5 LF-MCG/0.5 IM SUSY: 0.5000 mL | PREFILLED_SYRINGE | Freq: Once | INTRAMUSCULAR | Status: DC

## 2022-05-31 MED ORDER — OXYTOCIN BOLUS FROM INFUSION
333.0000 mL | Freq: Once | INTRAVENOUS | Status: AC
  Administered 2022-05-31: 333 mL via INTRAVENOUS

## 2022-05-31 MED ORDER — ONDANSETRON HCL 4 MG PO TABS: 4.0000 mg | ORAL_TABLET | ORAL | Status: DC | PRN

## 2022-05-31 MED ORDER — TERBUTALINE SULFATE 1 MG/ML IJ SOLN
0.2500 mg | Freq: Once | INTRAMUSCULAR | Status: AC
  Administered 2022-05-31: 0.25 mg via SUBCUTANEOUS

## 2022-05-31 MED ORDER — EPHEDRINE 5 MG/ML INJ: 10.0000 mg | INTRAVENOUS | Status: DC | PRN

## 2022-05-31 MED ORDER — ACETAMINOPHEN 325 MG PO TABS: 650.0000 mg | ORAL_TABLET | ORAL | Status: DC | PRN

## 2022-05-31 MED ORDER — COCONUT OIL OIL: 1.0000 | TOPICAL_OIL | Status: DC | PRN

## 2022-05-31 MED ORDER — SIMETHICONE 80 MG PO CHEW: 80.0000 mg | CHEWABLE_TABLET | ORAL | Status: DC | PRN

## 2022-05-31 MED ORDER — FENTANYL CITRATE (PF) 100 MCG/2ML IJ SOLN
50.0000 ug | Freq: Once | INTRAMUSCULAR | Status: AC
  Administered 2022-05-31: 50 ug via INTRAVENOUS
  Filled 2022-05-31: qty 2

## 2022-05-31 MED ORDER — LACTATED RINGERS IV SOLN
500.0000 mL | Freq: Once | INTRAVENOUS | Status: AC
  Administered 2022-05-31: 500 mL via INTRAVENOUS

## 2022-05-31 MED ORDER — PHENYLEPHRINE 80 MCG/ML (10ML) SYRINGE FOR IV PUSH (FOR BLOOD PRESSURE SUPPORT)
80.0000 ug | PREFILLED_SYRINGE | INTRAVENOUS | Status: DC | PRN
  Administered 2022-05-31: 80 ug via INTRAVENOUS

## 2022-05-31 MED ORDER — PHENYLEPHRINE 80 MCG/ML (10ML) SYRINGE FOR IV PUSH (FOR BLOOD PRESSURE SUPPORT)
80.0000 ug | PREFILLED_SYRINGE | INTRAVENOUS | Status: DC | PRN
  Filled 2022-05-31: qty 10

## 2022-05-31 MED ORDER — SOD CITRATE-CITRIC ACID 500-334 MG/5ML PO SOLN: 30.0000 mL | ORAL | Status: DC | PRN

## 2022-05-31 MED ORDER — FLEET ENEMA 7-19 GM/118ML RE ENEM: 1.0000 | ENEMA | RECTAL | Status: DC | PRN

## 2022-05-31 MED ORDER — LACTATED RINGERS IV SOLN
500.0000 mL | INTRAVENOUS | Status: DC | PRN
  Administered 2022-05-31: 500 mL via INTRAVENOUS

## 2022-05-31 MED ORDER — LACTATED RINGERS AMNIOINFUSION
200.0000 mL | INTRAVENOUS | Status: DC
  Administered 2022-05-31: 200 mL via INTRAUTERINE

## 2022-05-31 MED ORDER — SENNOSIDES-DOCUSATE SODIUM 8.6-50 MG PO TABS
2.0000 | ORAL_TABLET | Freq: Every day | ORAL | Status: DC
  Administered 2022-06-01: 2 via ORAL
  Filled 2022-05-31 (×2): qty 2

## 2022-05-31 MED ORDER — FENTANYL-BUPIVACAINE-NACL 0.5-0.125-0.9 MG/250ML-% EP SOLN
12.0000 mL/h | EPIDURAL | Status: DC | PRN
  Administered 2022-05-31: 12 mL/h via EPIDURAL
  Filled 2022-05-31: qty 250

## 2022-05-31 MED ORDER — DIBUCAINE (PERIANAL) 1 % EX OINT: 1.0000 | TOPICAL_OINTMENT | CUTANEOUS | Status: DC | PRN

## 2022-05-31 MED ORDER — TERBUTALINE SULFATE 1 MG/ML IJ SOLN
INTRAMUSCULAR | Status: AC
  Filled 2022-05-31: qty 1

## 2022-05-31 MED ORDER — DIPHENHYDRAMINE HCL 50 MG/ML IJ SOLN: 12.5000 mg | INTRAMUSCULAR | Status: DC | PRN

## 2022-05-31 MED ORDER — LIDOCAINE HCL (PF) 1 % IJ SOLN
30.0000 mL | INTRAMUSCULAR | Status: AC | PRN
  Administered 2022-05-31: 30 mL via SUBCUTANEOUS
  Filled 2022-05-31: qty 30

## 2022-05-31 MED ORDER — PRENATAL MULTIVITAMIN CH
1.0000 | ORAL_TABLET | Freq: Every day | ORAL | Status: DC
  Administered 2022-06-01: 1 via ORAL
  Filled 2022-05-31 (×2): qty 1

## 2022-05-31 MED ORDER — WITCH HAZEL-GLYCERIN EX PADS: 1.0000 | MEDICATED_PAD | CUTANEOUS | Status: DC | PRN

## 2022-05-31 MED ORDER — ONDANSETRON HCL 4 MG/2ML IJ SOLN
4.0000 mg | Freq: Four times a day (QID) | INTRAMUSCULAR | Status: DC | PRN
  Administered 2022-05-31: 4 mg via INTRAVENOUS
  Filled 2022-05-31: qty 2

## 2022-05-31 MED ORDER — OXYCODONE-ACETAMINOPHEN 5-325 MG PO TABS: 1.0000 | ORAL_TABLET | ORAL | Status: DC | PRN

## 2022-05-31 MED ORDER — ACETAMINOPHEN 325 MG PO TABS
650.0000 mg | ORAL_TABLET | ORAL | Status: DC | PRN
  Administered 2022-06-01: 650 mg via ORAL
  Filled 2022-05-31: qty 2

## 2022-05-31 MED ORDER — OXYCODONE-ACETAMINOPHEN 5-325 MG PO TABS: 2.0000 | ORAL_TABLET | ORAL | Status: DC | PRN

## 2022-05-31 MED ORDER — LACTATED RINGERS IV SOLN: INTRAVENOUS | Status: DC

## 2022-05-31 MED ORDER — OXYTOCIN-SODIUM CHLORIDE 30-0.9 UT/500ML-% IV SOLN
2.5000 [IU]/h | INTRAVENOUS | Status: DC
  Administered 2022-05-31: 2.5 [IU]/h via INTRAVENOUS
  Filled 2022-05-31: qty 500

## 2022-05-31 MED ORDER — ONDANSETRON HCL 4 MG/2ML IJ SOLN: 4.0000 mg | INTRAMUSCULAR | Status: DC | PRN

## 2022-05-31 MED ORDER — LIDOCAINE HCL (PF) 1 % IJ SOLN
INTRAMUSCULAR | Status: DC | PRN
  Administered 2022-05-31: 5 mL via EPIDURAL
  Administered 2022-05-31: 4 mL via EPIDURAL

## 2022-05-31 MED ORDER — IBUPROFEN 600 MG PO TABS
600.0000 mg | ORAL_TABLET | Freq: Four times a day (QID) | ORAL | Status: DC
  Administered 2022-05-31 – 2022-06-02 (×8): 600 mg via ORAL
  Filled 2022-05-31 (×8): qty 1

## 2022-05-31 NOTE — H&P (Signed)
OBSTETRIC ADMISSION HISTORY AND PHYSICAL  Alyssa Mcdaniel is a 22 y.o. female G54P0101 with IUP at [redacted]w[redacted]d by early ultrasound presenting for SOL. She reports +FMs, No LOF, no VB, no blurry vision, headaches or peripheral edema, and RUQ pain.  Her obstetrical history is significant for twin delivery a year ago at 27wks for abruption, she had Baby A at home and Baby B by stat CS. Baby B has since passed from SIDS. Desires TOLAC (consent signed 04/03/22). She plans on formula feeding. She requests condoms for birth control. She received her prenatal care at [facility]  Dating: By [redacted]w[redacted]d U/S --->  Estimated Date of Delivery: 06/11/22 Sono:  @[redacted]w[redacted]d , CWD, normal anatomy, cephalic presentation, posterior placenta, 2263g, 14% EFW 5lb  Prenatal History/Complications: [facility] in first trimester (resolved), anemia (mild), one elevated BP  Nursing Staff Provider  Office Location [facility] Dating  06/11/2022, by Ultrasound  [facility] Model Arly.Keller ] Traditional [ ]  Centering [ ]  Mom-Baby Dyad Anatomy US   01/22/22 f/u scheduled  Language  English     Flu Vaccine  Declines  Genetic/Carrier Screen  NIPS:   Negative AFP: declined Horizon: previously negative  TDaP Vaccine   Declined Hgb A1C or  GTT Early: 5.5 Third trimester: 85/112/129  COVID Vaccine  No   LAB RESULTS   Rhogam  B/Positive/-- (11/13 1421)  Blood Type B/Positive/-- (11/13 1421)   Baby Feeding Plan Formula  Antibody Negative (11/13 1421)  Contraception Progesterone only options discussed  Rubella 1.36 (11/13 1421)  Circumcision Yes RPR Non Reactive (03/04 0837)   Pediatrician  [facility]  HBsAg Negative (11/13 1421)   Support Person [name] HCVAb Non Reactive (11/13 1421)   Prenatal Classes  Offered  HIV Non Reactive (03/04 0837)     BTL Consent  GBS Negative/-- (04/29 1400) (For PCN allergy, check sensitivities)   VBAC Consent  Pap Not indicated due to age       DME Rx [ ]  BP cuff [ ]  Weight Scale Waterbirth  [ ]  Class [ ]  Consent [ ]  CNM visit   PHQ9 & GAD7 [X]  new OB [  ] 28 weeks  [  ] 36 weeks Induction  [ ]  Orders Entered [ ] Foley Y/N   Past Medical History: Past Medical History:  Diagnosis Date   Asthma    Depression    Headache    PCOS (polycystic ovarian syndrome)    Sleeping difficulty 02/24/2018   Superficial thrombophlebitis in puerperium 03/15/2021   Tension headache 02/24/2018   Past Surgical History: Past Surgical History:  Procedure Laterality Date   ADENOIDECTOMY     CESAREAN SECTION MULTI-GESTATIONAL N/A 03/10/2021   Procedure: CESAREAN SECTION MULTI-GESTATIONAL;  Surgeon: Tereso Newcomer, MD;  Location: [facility] LD ORS;  Service: Obstetrics;  Laterality: N/A;   TONSILLECTOMY     Obstetrical History: OB History     Gravida  2   Para  1   Term  0   Preterm  1   AB  0   Living  1      SAB  0   IAB      Ectopic  0   Multiple  1   Live Births  2        Obstetric Comments  Twin A delivered @ Home         Social History Social History   Socioeconomic History   Marital status: Single    Spouse name: Not on file   Number of children: Not on  file   Years of education: Not on file   Highest education level: Not on file  Occupational History   Not on file  Tobacco Use   Smoking status: Never    Passive exposure: Yes   Smokeless tobacco: Never  Vaping Use   Vaping Use: Never used  Substance and Sexual Activity   Alcohol use: No   Drug use: Not Currently    Types: Marijuana    Comment: occasional - last smoked 11/03/21   Sexual activity: Not Currently    Birth control/protection: None  Other Topics Concern   Not on file  Social History Narrative   Lives with mom and siblings.    Getting GED   Social Determinants of Health   Financial Resource Strain: Not on file  Food Insecurity: Food Insecurity Present (01/30/2021)   Hunger Vital Sign    Worried About Running Out of Food in the Last Year: Sometimes true    Ran Out of Food in the Last Year: Never true   Transportation Needs: No Transportation Needs (01/30/2021)   PRAPARE - Administrator, Civil Service (Medical): No    Lack of Transportation (Non-Medical): No  Recent Concern: Transportation Needs - Unmet Transportation Needs (12/19/2020)   PRAPARE - Administrator, Civil Service (Medical): Yes    Lack of Transportation (Non-Medical): No  Physical Activity: Not on file  Stress: Not on file  Social Connections: Not on file   Family History: Family History  Problem Relation Age of Onset   Hypertension Mother    Anxiety disorder Mother    Depression Mother    Hypertension Maternal Aunt    Anxiety disorder Maternal Grandmother    Depression Maternal Grandmother    Breast cancer Maternal Grandmother 24   Anxiety disorder Maternal Grandfather    Depression Maternal Grandfather    Diabetes Other    Cancer Other    Hypertension Other    Migraines Neg Hx    Seizures Neg Hx    Autism Neg Hx    ADD / ADHD Neg Hx    Bipolar disorder Neg Hx    Schizophrenia Neg Hx    Allergies: No Known Allergies  Medications Prior to Admission  Medication Sig Dispense Refill Last Dose   Prenatal Vit-Fe Fumarate-FA (PRENATAL VITAMINS) 28-0.8 MG TABS Take 1 tablet by mouth daily. 30 tablet 10 05/30/2022   sertraline (ZOLOFT) 50 MG tablet Take 1 tablet (50 mg total) by mouth daily. 90 tablet 3 05/30/2022   albuterol (VENTOLIN HFA) 108 (90 Base) MCG/ACT inhaler Inhale 2 puffs into the lungs every 6 (six) hours as needed for shortness of breath. For shortness of breath 18 g 3 More than a month   aspirin EC 81 MG tablet Take 1 tablet (81 mg total) by mouth daily. Take after 12 weeks for prevention of preeclampsia later in pregnancy 300 tablet 2 05/29/2022   famotidine (PEPCID) 20 MG tablet Take 20 mg by mouth 2 (two) times daily.   More than a month   ferrous sulfate 325 (65 FE) MG EC tablet Take 1 tablet (325 mg total) by mouth every other day. 30 tablet 3 05/28/2022   fluticasone  (FLOVENT HFA) 110 MCG/ACT inhaler Inhale 2 puffs into the lungs in the morning and at bedtime. 1 each 12    ondansetron (ZOFRAN-ODT) 8 MG disintegrating tablet Take 8 mg by mouth every 8 (eight) hours as needed for nausea or vomiting.   More than a month  Review of Systems  All systems reviewed and negative except as stated in HPI  Blood pressure 122/87, pulse 83, temperature 97.7 F (36.5 C), temperature source Oral, resp. rate 18, height 5\' 4"  (1.626 m), weight 228 lb (103.4 kg), last menstrual period 08/20/2021, SpO2 100 %, currently breastfeeding. General appearance: alert, cooperative, appears stated age, and no distress Lungs: clear to auscultation bilaterally Heart: regular rate and rhythm Abdomen: soft, non-tender; bowel sounds normal Pelvic: normal external female genitalia, moderate meconium stained fluid Extremities: Homans sign is negative, no sign of DVT DTR's normal Presentation: cephalic Fetal monitoring - Baseline: 130 bpm, Variability: Good {> 6 bpm), Accelerations: Reactive, and Decelerations: Variable: mild Uterine activity - Date/time of onset: started 05/29/22, became stronger overnight, Frequency: Every 3-4 minutes, Duration: 60 seconds, and Intensity: strong Dilation: 7 Effacement (%): 80 Station: 0 Exam by:: Roma Schanz, CNM  Prenatal labs: ABO, Rh: --/--/B POS (05/11 0700) Antibody: NEG (05/11 0700) Rubella: 1.36 (11/13 1421) RPR: Non Reactive (03/04 0837)  HBsAg: Negative (11/13 1421)  HIV: Non Reactive (03/04 0837)  GBS: Negative/-- (04/29 1400)  2hr GTT: 85/112/129 (normal) Genetic screening: normal Anatomy U/S: normal  Prenatal Transfer Tool  Maternal Diabetes: No Genetic Screening: Normal Maternal Ultrasounds/Referrals: Normal Fetal Ultrasounds or other Referrals:  Referred to Materal Fetal Medicine  Maternal Substance Abuse:  No Significant Maternal Medications:  None Significant Maternal Lab Results:  Group B Strep negative Number of  Prenatal Visits:greater than 3 verified prenatal visits Other Comments:  None  Results for orders placed or performed during the hospital encounter of 05/31/22 (from the past 24 hour(s))  Type and screen [facility]   Collection Time: 05/31/22  7:00 AM  Result Value Ref Range   ABO/RH(D) B POS    Antibody Screen NEG    Sample Expiration      06/03/2022,2359 Performed at [facility], [address], [place], [place] [address]   CBC   Collection Time: 05/31/22  7:02 AM  Result Value Ref Range   WBC 7.4 4.0 - 10.5 K/uL   RBC 4.14 3.87 - 5.11 MIL/uL   Hemoglobin 11.5 (L) 12.0 - 15.0 g/dL   HCT 60.4 (L) 54.0 - 98.1 %   MCV 83.6 80.0 - 100.0 fL   MCH 27.8 26.0 - 34.0 pg   MCHC 33.2 30.0 - 36.0 g/dL   RDW 19.1 47.8 - 29.5 %   Platelets 357 150 - 400 K/uL   nRBC 0.0 0.0 - 0.2 %   Patient Active Problem List   Diagnosis Date Noted   Normal labor 05/31/2022   Anemia affecting pregnancy in third trimester 04/01/2022   Supervision of high risk pregnancy, antepartum 11/12/2021   History of cesarean section 11/12/2021   history of 27 weeks delivery 11/12/2021   Subchorionic hematoma in first trimester 10/29/2021   Depression 11/13/2020   Migraine without aura and without status migrainosus, not intractable 02/24/2018   Asthma 10/02/2014   Assessment/Plan:  Jalaysha Graybeal is a 22 y.o. G2P0101 at [redacted]w[redacted]d here for SOL (TOLAC)  #Labor: Progressing well without augmentation, SROM occurred with moderate meconium shortly after epidural placement. Has an EBG doula at bedside (Daija). IUPC placed, planning to start amnioinfusion for recurrent variables/early decelerations. #Pain: Well-controlled with epidural in place #FWB: Cat 2 - starting amnio infusion, MD aware #ID: GBS neg #MOF: formula #MOC: condoms #Circ: yes  Bernerd Limbo, CNM  05/31/2022, 9:18 AM

## 2022-05-31 NOTE — Discharge Summary (Signed)
Postpartum Discharge Summary  Date of Service updated***     Patient Name: Alyssa Mcdaniel DOB: November 24, 2000 MRN: [id number]  Date of admission: 05/31/2022 Delivery date:05/31/2022  Delivering provider: Edd Arbour R  Date of discharge: 05/31/2022  Admitting diagnosis: Normal labor [O80, Z37.9] Intrauterine pregnancy: [redacted]w[redacted]d     Secondary diagnosis:  Principal Problem:   Normal labor  Additional problems: ***    Discharge diagnosis: Term Pregnancy Delivered and VBAC                                              Post partum procedures:{Postpartum procedures:23558} Augmentation: N/A Complications: None  Hospital course: Onset of Labor With Vaginal Delivery      22 y.o. yo G2P0101 at [redacted]w[redacted]d was admitted in Active Labor on 05/31/2022. Labor course was uncomplicated.  Membrane Rupture Time/Date: 8:29 AM ,05/31/2022   Delivery Method:Vaginal, Spontaneous  Episiotomy: None  Lacerations:  Labial  Patient had a postpartum course complicated by ***.  She is ambulating, tolerating a regular diet, passing flatus, and urinating well. Patient is discharged home in stable condition on 05/31/22.  Newborn Data: Birth date:05/31/2022  Birth time:1:21 PM  Gender:Female  Living status:Living  Apgars:9 ,9  Weight:   Magnesium Sulfate received: No BMZ received: No Rhophylac:N/A MMR:N/A T-DaP:{Tdap:23962} Flu: No Transfusion:No  Physical exam  Vitals:   05/31/22 1350 05/31/22 1400 05/31/22 1415 05/31/22 1430  BP: 123/70 123/72 116/73 120/62  Pulse: 78 73 70 69  Resp:      Temp:      TempSrc:      SpO2:      Weight:      Height:       General: {Exam; general:21111117} Lochia: {Desc; appropriate/inappropriate:30686::"appropriate"} Uterine Fundus: {Desc; firm/soft:30687} Incision: {Exam; incision:21111123} DVT Evaluation: {Exam; dvt:2111122} Labs: Lab Results  Component Value Date   WBC 7.4 05/31/2022   HGB 11.5 (L) 05/31/2022   HCT 34.6 (L) 05/31/2022   MCV 83.6  05/31/2022   PLT 357 05/31/2022      Latest Ref Rng & Units 02/27/2021    2:23 PM  CMP  Glucose 70 - 99 mg/dL 81   BUN 6 - 20 mg/dL 3   Creatinine 1.19 - 1.47 mg/dL 8.29   Sodium 562 - 130 mmol/L 137   Potassium 3.5 - 5.2 mmol/L 3.7   Chloride 96 - 106 mmol/L 102   CO2 20 - 29 mmol/L 23   Calcium 8.7 - 10.2 mg/dL 8.5   Total Protein 6.0 - 8.5 g/dL 5.9   Total Bilirubin 0.0 - 1.2 mg/dL <8.6   Alkaline Phos 42 - 106 IU/L 110   AST 0 - 40 IU/L 25   ALT 0 - 32 IU/L 54    Edinburgh Score:    04/22/2021    3:50 PM  Edinburgh Postnatal Depression Scale Screening Tool  I have been able to laugh and see the funny side of things. 0  I have looked forward with enjoyment to things. 0  I have blamed myself unnecessarily when things went wrong. 3  I have been anxious or worried for no good reason. 2  I have felt scared or panicky for no good reason. 0  Things have been getting on top of me. 1  I have been so unhappy that I have had difficulty sleeping. 0  I have felt sad or miserable. 2  I have been so unhappy that I have been crying. 2  The thought of harming myself has occurred to me. 0  Edinburgh Postnatal Depression Scale Total 10   After visit meds:  Allergies as of 05/31/2022   No Known Allergies   Med Rec must be completed prior to using this [facility]***     Discharge home in stable condition Infant Feeding: {Baby feeding:23562} Infant Disposition:{CHL IP OB HOME WITH ZOXWRU:04540} Discharge instruction: per After Visit Summary and Postpartum booklet. Activity: Advance as tolerated. Pelvic rest for 6 weeks.  Diet: {OB JWJX:[id number]}  Future Appointments: Future Appointments  Date Time Provider Department Center  06/03/2022  1:15 PM Myrtie Hawk, DO [facility]   Follow up Visit: Message sent to [facility] on 05/31/22 by Edd Arbour, CNM Please schedule this patient for a In person postpartum visit in 4 weeks with the following provider: APP. Additional  Postpartum F/U: None   Low risk pregnancy complicated by:  None Delivery mode:  Vaginal, Spontaneous  Anticipated Birth Control:  Condoms   05/31/2022 Bernerd Limbo, CNM

## 2022-05-31 NOTE — Progress Notes (Signed)
Patient ID: Alyssa Mcdaniel, female   DOB: [birth year], 22 y.o.   MRN: [id number]  BP 113/60   Pulse 64   Temp 97.6 F (36.4 C) (Oral)   Resp 18   Ht 5\' 4"  (1.626 m)   Wt 228 lb (103.4 kg)   LMP 08/20/2021 (Approximate)   SpO2 100%   BMI 39.14 kg/m   To room for prolonged deceleration, had changed position to right lateral and fetal heart rate went down into the 60s-70s. Pt returned to semi-fowlers. Terbutaline given and FSE placed. FHR returned to 130s with good variability, variables with contractions. Cervical exam unchanged but cervix very soft and pliable. MD in room while we test pushed to see if we could reduce the cervix but with pushing it became tightly applied to the fetal head and only 7cm. Tracing much improved so will monitor closely but wait for complete dilation. Pt, [name] and doula reassured and questions answered.   Cautiously anticipated SVD, but OR aware of fetal status and increased possibility of CS.  Edd Arbour, CNM, MSN, IBCLC Certified Nurse Midwife, [facility]

## 2022-05-31 NOTE — MAU Note (Signed)
Pt says UC's strong Was here yesterday  Denies HSV GBS- neg

## 2022-05-31 NOTE — Anesthesia Procedure Notes (Signed)
Epidural Patient location during procedure: OB Start time: 05/31/2022 8:01 AM End time: 05/31/2022 8:06 AM  Staffing Anesthesiologist: Beryle Lathe, MD Performed: anesthesiologist   Preanesthetic Checklist Completed: patient identified, IV checked, risks and benefits discussed, monitors and equipment checked, pre-op evaluation and timeout performed  Epidural Patient position: sitting Prep: DuraPrep Patient monitoring: continuous pulse ox and blood pressure Approach: midline Location: L2-L3 Injection technique: LOR saline  Needle:  Needle type: Tuohy  Needle gauge: 17 G Needle length: 9 cm Needle insertion depth: 8 cm Catheter size: 19 Gauge Catheter at skin depth: 13 cm Test dose: negative and Other (1% lidocaine)  Assessment Events: blood not aspirated and no cerebrospinal fluid  Additional Notes Patient identified. Risks including, but not limited to, bleeding, infection, nerve damage, paralysis, inadequate analgesia, blood pressure changes, nausea, vomiting, allergic reaction, postpartum back pain, itching, and headache were discussed. Patient expressed understanding and wished to proceed. Sterile prep and drape, including hand hygiene, mask, and sterile gloves were used. The patient was positioned and the spine was prepped. The skin was anesthetized with lidocaine. No paraesthesia or other complication noted. The patient did not experience any signs of intravascular injection such as tinnitus or metallic taste in mouth, nor signs of intrathecal spread such as rapid motor block. Please see nursing notes for vital signs. The patient tolerated the procedure well.   Leslye Peer, MDReason for block:procedure for pain

## 2022-05-31 NOTE — Progress Notes (Signed)
Pt informed that the ultrasound is considered a limited OB ultrasound and is not intended to be a complete ultrasound exam.  Patient also informed that the ultrasound is not being completed with the intent of assessing for fetal or placental anomalies or any pelvic abnormalities.  Explained that the purpose of today's ultrasound is to assess for  presentation.  Patient acknowledges the purpose of the exam and the limitations of the study.    Vertex presentation confirmed.   Rolm Bookbinder, CNM 05/31/22 6:54 AM

## 2022-05-31 NOTE — Anesthesia Preprocedure Evaluation (Addendum)
Anesthesia Evaluation  Patient identified by MRN, date of birth, ID band Patient awake    Reviewed: Allergy & Precautions, NPO status , Patient's Chart, lab work & pertinent test results  History of Anesthesia Complications Negative for: history of anesthetic complications  Airway Mallampati: II   Neck ROM: Full    Dental   Pulmonary asthma    Pulmonary exam normal        Cardiovascular negative cardio ROS Normal cardiovascular exam     Neuro/Psych  Headaches PSYCHIATRIC DISORDERS  Depression       GI/Hepatic negative GI ROS, Neg liver ROS,,,  Endo/Other   Obesity    Renal/GU negative Renal ROS     Musculoskeletal negative musculoskeletal ROS (+)    Abdominal   Peds  Hematology  (+) Blood dyscrasia, anemia  Plt 357k    Anesthesia Other Findings   Reproductive/Obstetrics (+) Pregnancy  PCOS TOLAC Had vaginal delivery of Twin A followed by stat C/S for Twin B ~15 months ago                              Anesthesia Physical Anesthesia Plan  ASA: 2  Anesthesia Plan: Epidural   Post-op Pain Management:    Induction:   PONV Risk Score and Plan: 2 and Treatment may vary due to age or medical condition  Airway Management Planned: Natural Airway  Additional Equipment: None  Intra-op Plan:   Post-operative Plan:   Informed Consent: I have reviewed the patients History and Physical, chart, labs and discussed the procedure including the risks, benefits and alternatives for the proposed anesthesia with the patient or authorized representative who has indicated his/her understanding and acceptance.       Plan Discussed with: Anesthesiologist  Anesthesia Plan Comments: (Labs reviewed. Platelets acceptable, patient not taking any blood thinning medications. Per RN, FHR tracing reported to be stable enough for sitting procedure. Risks and benefits discussed with patient,  including PDPH, backache, epidural hematoma, failed epidural, blood pressure changes, allergic reaction, and nerve injury. Patient expressed understanding and wished to proceed.)       Anesthesia Quick Evaluation

## 2022-05-31 NOTE — Plan of Care (Signed)

## 2022-06-01 LAB — CBC
HCT: 27.6 % — ABNORMAL LOW (ref 36.0–46.0)
Hemoglobin: 9.3 g/dL — ABNORMAL LOW (ref 12.0–15.0)
MCH: 27.9 pg (ref 26.0–34.0)
MCHC: 33.7 g/dL (ref 30.0–36.0)
MCV: 82.9 fL (ref 80.0–100.0)
Platelets: 288 10*3/uL (ref 150–400)
RBC: 3.33 MIL/uL — ABNORMAL LOW (ref 3.87–5.11)
RDW: 14.2 % (ref 11.5–15.5)
WBC: 13 10*3/uL — ABNORMAL HIGH (ref 4.0–10.5)
nRBC: 0 % (ref 0.0–0.2)

## 2022-06-01 LAB — RPR: RPR Ser Ql: NONREACTIVE

## 2022-06-01 MED ORDER — FERROUS SULFATE 325 (65 FE) MG PO TABS
325.0000 mg | ORAL_TABLET | ORAL | Status: DC
  Administered 2022-06-01: 325 mg via ORAL
  Filled 2022-06-01: qty 1

## 2022-06-01 NOTE — Social Work (Signed)
[name] was referred for history of depression/anxiety. ?* Referral screened out by Clinical Social Worker because none of the following criteria appear to apply: ?~ History of anxiety/depression during this pregnancy, or of post-partum depression following prior delivery. ?~ Diagnosis of anxiety and/or depression within last 3 years ?OR ?* [name]'s symptoms currently being treated with medication and/or therapy. ? ?Please contact the Clinical Social Worker if needs arise, by [name] request, or if [name] scores greater than 9/yes to question 10 on Edinburgh Postpartum Depression Screen.  ? ?Talajah Slimp, LCSW ?Clinical Social Worker ? ?

## 2022-06-01 NOTE — Progress Notes (Signed)
POSTPARTUM PROGRESS NOTE  Post Partum Day 1  Subjective:  Alyssa Mcdaniel is a 22 y.o. Z6X0960 s/p VD at [redacted]w[redacted]d.  She reports she is doing well. No acute events overnight. She denies any problems with ambulating, voiding or po intake. Denies nausea or vomiting.  Pain is well controlled.  Lochia is adequate.  Objective: Blood pressure 132/74, pulse (!) 59, temperature 98 F (36.7 C), temperature source Oral, resp. rate 17, height 5\' 4"  (1.626 m), weight 103.4 kg, last menstrual period 08/20/2021, SpO2 100 %, unknown if currently breastfeeding.  Physical Exam:  General: alert, cooperative and no distress Chest: no respiratory distress Heart:regular rate, distal pulses intact Uterine Fundus: firm, appropriately tender DVT Evaluation: No calf swelling or tenderness Extremities: no edema Skin: warm, dry  Recent Labs    05/31/22 0702 06/01/22 0455  HGB 11.5* 9.3*  HCT 34.6* 27.6*    Assessment/Plan: Alyssa Mcdaniel is a 22 y.o. A5W0981 s/p VD at [redacted]w[redacted]d   PPD#1 - Doing well  Routine postpartum care Anemia: PO Fe Contraception: unsure Feeding: Breast/bottle Dispo: Plan for discharge tomorow.   LOS: 1 day   Myrtie Hawk, DO OB Fellow  06/01/2022, 9:41 AM

## 2022-06-01 NOTE — Anesthesia Postprocedure Evaluation (Signed)
Anesthesia Post Note  Patient: Alyssa Mcdaniel  Procedure(s) Performed: AN AD HOC LABOR EPIDURAL     Patient location during evaluation: Mother Baby Anesthesia Type: Epidural Level of consciousness: awake and alert Pain management: pain level controlled Vital Signs Assessment: post-procedure vital signs reviewed and stable Respiratory status: spontaneous breathing, nonlabored ventilation and respiratory function stable Cardiovascular status: stable Postop Assessment: no headache, no backache, epidural receding, no apparent nausea or vomiting, patient able to bend at knees, able to ambulate and adequate PO intake Anesthetic complications: no   No notable events documented.  Last Vitals:  Vitals:   06/01/22 0100 06/01/22 0500  BP: 123/72 132/74  Pulse: 70 (!) 59  Resp: 17 17  Temp: 37 C 36.7 C  SpO2: 99% 100%    Last Pain:  Vitals:   06/01/22 0500  TempSrc: Oral  PainSc: 2    Pain Goal:                   [employer]

## 2022-06-02 MED ORDER — SENNOSIDES-DOCUSATE SODIUM 8.6-50 MG PO TABS: 2.0000 | ORAL_TABLET | Freq: Every day | ORAL | 0 refills | Status: DC | PRN

## 2022-06-03 ENCOUNTER — Encounter: Payer: Medicaid Other | Admitting: Family Medicine

## 2022-06-04 ENCOUNTER — Other Ambulatory Visit: Payer: Self-pay

## 2022-06-04 ENCOUNTER — Ambulatory Visit (INDEPENDENT_AMBULATORY_CARE_PROVIDER_SITE_OTHER): Payer: Medicaid Other

## 2022-06-04 VITALS — BP 112/87 | HR 65 | Ht 64.0 in | Wt 221.5 lb

## 2022-06-04 DIAGNOSIS — Z0130 Encounter for examination of blood pressure without abnormal findings: Secondary | ICD-10-CM

## 2022-06-04 MED ORDER — AMLODIPINE BESYLATE 5 MG PO TABS
5.0000 mg | ORAL_TABLET | Freq: Every day | ORAL | 0 refills | Status: DC
Start: 2022-06-04 — End: 2023-01-30

## 2022-06-04 NOTE — Progress Notes (Signed)
Blood Pressure Check Visit  Alyssa Mcdaniel is here for blood pressure check following spontaneous vaginal on 05/31/22. BP today is 128/91and 112/87. Patient denies any dizzness, blurred vision, shortness of breath, or peripheral edema. Patient dose report headaches and chest heaviness.   Patient not currently prescribed anything for blood pressure.  Norvasc 5mg , 1 week bp check, cbc, cmp   Reviewed with Edd Arbour, CNM. Per provider, starting patient on Norvasc 5mg  PO QD. Patient to return in 1 week for another BP check. Provider ordered CMP and CBC in office today. Instructed patient on how and when to take BP at home. Patient has no other questions or concerns.   Meryl Crutch, RN 06/04/2022  2:57 PM

## 2022-06-04 NOTE — Patient Instructions (Signed)
Blood Pressure Check Visit on Wednesday, May 22nd at [facility] at the [facility].

## 2022-06-05 LAB — COMPREHENSIVE METABOLIC PANEL
ALT: 11 IU/L (ref 0–32)
AST: 12 IU/L (ref 0–40)
Albumin/Globulin Ratio: 1.4 (ref 1.2–2.2)
Albumin: 3.3 g/dL — ABNORMAL LOW (ref 4.0–5.0)
Alkaline Phosphatase: 158 IU/L — ABNORMAL HIGH (ref 42–106)
BUN/Creatinine Ratio: 11 (ref 9–23)
BUN: 6 mg/dL (ref 6–20)
Bilirubin Total: <0.2 mg/dL (ref 0.0–1.2)
CO2: 22 mmol/L (ref 20–29)
Calcium: 8.8 mg/dL (ref 8.7–10.2)
Chloride: 105 mmol/L (ref 96–106)
Creatinine, Ser: 0.56 mg/dL — ABNORMAL LOW (ref 0.57–1.00)
Globulin, Total: 2.4 g/dL (ref 1.5–4.5)
Glucose: 80 mg/dL (ref 70–99)
Potassium: 4 mmol/L (ref 3.5–5.2)
Sodium: 141 mmol/L (ref 134–144)
Total Protein: 5.7 g/dL — ABNORMAL LOW (ref 6.0–8.5)
eGFR: 135 mL/min/{1.73_m2} (ref >59)

## 2022-06-05 LAB — CBC
Hematocrit: 30.1 % — ABNORMAL LOW (ref 34.0–46.6)
Hemoglobin: 10.1 g/dL — ABNORMAL LOW (ref 11.1–15.9)
MCH: 27.4 pg (ref 26.6–33.0)
MCHC: 33.6 g/dL (ref 31.5–35.7)
MCV: 82 fL (ref 79–97)
Platelets: 332 10*3/uL (ref 150–450)
RBC: 3.68 x10E6/uL — ABNORMAL LOW (ref 3.77–5.28)
RDW: 14.3 % (ref 11.7–15.4)
WBC: 6.2 10*3/uL (ref 3.4–10.8)

## 2022-06-11 ENCOUNTER — Ambulatory Visit: Payer: Medicaid Other

## 2022-06-26 ENCOUNTER — Telehealth: Payer: Self-pay | Admitting: General Practice

## 2022-06-26 NOTE — Telephone Encounter (Signed)
Alyssa Mcdaniel from [facility] called and left message on nurse voicemail line stating she went out and saw the patient for a home visit and her BP was 120/94 & 120/96. The patient did report she hadn't taken her medicine that day for her BP. Patient scored a 10 on her edinburgh and is aware of resources and options but declines at this time. Per chart review, patient has pp visit scheduled for 6/18.

## 2022-07-01 NOTE — Telephone Encounter (Signed)
[facility] RN left second VM on 06/27/22 requesting call back to discuss patient.

## 2022-07-04 NOTE — Telephone Encounter (Signed)
Called [facility] nurse Olegario Messier) and she did not answer. I left a message on her personal voicemail stating that I am returning her calls from last week. If she still has concerns regarding this pt she may leave a new message or call the on-call Ob provider to discuss. Per chart review, pt has not had any visits to hospital since discharge on 06/02/22.

## 2022-07-08 ENCOUNTER — Ambulatory Visit: Payer: Medicaid Other | Admitting: Obstetrics & Gynecology

## 2022-07-08 NOTE — Progress Notes (Deleted)
    Post Partum Visit Note  Alyssa Mcdaniel is a 22 y.o. G32P1102 female who presents for a postpartum visit. She is  5.3  weeks postpartum following a normal spontaneous vaginal delivery.  I have fully reviewed the prenatal and intrapartum course. The delivery was at 38.3 gestational weeks.  Anesthesia: epidural. Postpartum course has been ***. Baby is doing well***. Baby is feeding by bottle - {formula:72}. Bleeding {vag bleed:12292}. Bowel function is {normal:32111}. Bladder function is {normal:32111}. Patient {is/is not:9024} sexually active. Contraception method is condoms. Postpartum depression screening: {gen negative/positive:315881}.   The pregnancy intention screening data noted above was reviewed. Potential methods of contraception were discussed. The patient elected to proceed with No data recorded.    Health Maintenance Due  Topic Date Due   HPV VACCINES (1 - 2-dose series) Never done    {Common ambulatory SmartLinks:19316}  Review of Systems {ros; complete:30496}  Objective:  There were no vitals taken for this visit.   General:  {gen appearance:16600}   Breasts:  {desc; normal/abnormal/not indicated:14647}  Lungs: {lung exam:16931}  Heart:  {heart exam:5510}  Abdomen: {abdomen exam:16834}   Wound {Wound assessment:11097}  GU exam:  {desc; normal/abnormal/not indicated:14647}       Assessment:    There are no diagnoses linked to this encounter.  *** postpartum exam.   Plan:   Essential components of care per ACOG recommendations:  1.  Mood and well being: Patient with {gen negative/positive:315881} depression screening today. Reviewed local resources for support.  - Patient tobacco use? {tobacco use:25506}  - hx of drug use? {yes/no:25505}    2. Infant care and feeding:  -Patient currently breastmilk feeding? {yes/no:25502}  -Social determinants of health (SDOH) reviewed in EPIC. No concerns***The following needs were identified***  3. Sexuality,  contraception and birth spacing - Patient {DOES_DOES WUJ:81191} want a pregnancy in the next year.  Desired family size is {NUMBER 1-10:22536} children.  - Reviewed reproductive life planning. Reviewed contraceptive methods based on pt preferences and effectiveness.  Patient desired {Upstream End Methods:24109} today.   - Discussed birth spacing of 18 months  4. Sleep and fatigue -Encouraged family/partner/community support of 4 hrs of uninterrupted sleep to help with mood and fatigue  5. Physical Recovery  - Discussed patients delivery and complications. She describes her labor as {description:25511} - Patient had a {CHL AMB DELIVERY:[phone]}. Patient had a {laceration:25518} laceration. Perineal healing reviewed. Patient expressed understanding - Patient has urinary incontinence? {yes/no:25515} - Patient {ACTION; IS/IS YNW:29562130} safe to resume physical and sexual activity  6.  Health Maintenance - HM due items addressed {Yes or If no, why not?:20788} - Last pap smear No results found for: "DIAGPAP" Pap smear {done:10129} at today's visit.  -Breast Cancer screening indicated? {indicated:25516}  7. Chronic Disease/Pregnancy Condition follow up: {Follow up:25499}  - PCP follow up  Guy Begin, CMA [facility] [employer], [facility]

## 2022-11-26 ENCOUNTER — Telehealth (INDEPENDENT_AMBULATORY_CARE_PROVIDER_SITE_OTHER): Payer: Self-pay | Admitting: Pharmacist

## 2022-11-26 NOTE — Telephone Encounter (Signed)
Called to inform pt about new pt apt.

## 2022-11-27 ENCOUNTER — Encounter (INDEPENDENT_AMBULATORY_CARE_PROVIDER_SITE_OTHER): Payer: Self-pay

## 2022-11-27 ENCOUNTER — Ambulatory Visit (INDEPENDENT_AMBULATORY_CARE_PROVIDER_SITE_OTHER): Payer: Medicaid Other | Admitting: Primary Care

## 2022-12-16 ENCOUNTER — Ambulatory Visit (INDEPENDENT_AMBULATORY_CARE_PROVIDER_SITE_OTHER): Payer: Medicaid Other | Admitting: Primary Care

## 2023-01-06 ENCOUNTER — Ambulatory Visit: Payer: Medicaid Other | Admitting: Nurse Practitioner

## 2023-01-25 ENCOUNTER — Emergency Department (HOSPITAL_COMMUNITY)
Admission: EM | Admit: 2023-01-25 | Discharge: 2023-01-26 | Discharge status: Against Medical Advice | Payer: Medicaid Other | Attending: Emergency Medicine | Admitting: Emergency Medicine

## 2023-01-25 DIAGNOSIS — M549 Dorsalgia, unspecified: Secondary | ICD-10-CM | POA: Insufficient documentation

## 2023-01-25 DIAGNOSIS — Z5321 Procedure and treatment not carried out due to patient leaving prior to being seen by health care provider: Secondary | ICD-10-CM | POA: Diagnosis not present

## 2023-01-25 DIAGNOSIS — R0789 Other chest pain: Secondary | ICD-10-CM | POA: Diagnosis present

## 2023-01-26 ENCOUNTER — Other Ambulatory Visit: Payer: Self-pay

## 2023-01-26 ENCOUNTER — Encounter (HOSPITAL_COMMUNITY): Payer: Self-pay

## 2023-01-26 ENCOUNTER — Ambulatory Visit: Payer: Medicaid Other | Admitting: Nurse Practitioner

## 2023-01-26 ENCOUNTER — Emergency Department (HOSPITAL_COMMUNITY): Payer: Medicaid Other

## 2023-01-26 LAB — CBC
HCT: 38.4 % (ref 36.0–46.0)
Hemoglobin: 12.3 g/dL (ref 12.0–15.0)
MCH: 27.2 pg (ref 26.0–34.0)
MCHC: 32 g/dL (ref 30.0–36.0)
MCV: 84.8 fL (ref 80.0–100.0)
Platelets: 350 10*3/uL (ref 150–400)
RBC: 4.53 MIL/uL (ref 3.87–5.11)
RDW: 15 % (ref 11.5–15.5)
WBC: 10.6 10*3/uL — ABNORMAL HIGH (ref 4.0–10.5)
nRBC: 0 % (ref 0.0–0.2)

## 2023-01-26 LAB — TROPONIN I (HIGH SENSITIVITY)
Troponin I (High Sensitivity): 4 ng/L (ref <18)
Troponin I (High Sensitivity): 3 ng/L (ref <18)

## 2023-01-26 LAB — BASIC METABOLIC PANEL
Anion gap: 11 (ref 5–15)
BUN: 10 mg/dL (ref 6–20)
CO2: 22 mmol/L (ref 22–32)
Calcium: 9.1 mg/dL (ref 8.9–10.3)
Chloride: 102 mmol/L (ref 98–111)
Creatinine, Ser: 0.72 mg/dL (ref 0.44–1.00)
GFR, Estimated: >60 mL/min (ref >60)
Glucose, Bld: 93 mg/dL (ref 70–99)
Potassium: 3.6 mmol/L (ref 3.5–5.1)
Sodium: 135 mmol/L (ref 135–145)

## 2023-01-26 LAB — HCG, SERUM, QUALITATIVE: Preg, Serum: NEGATIVE

## 2023-01-26 NOTE — ED Triage Notes (Signed)
 Pt arrived from home via POV c/o chest tightness and back pain 4/10 for both. Pt states that the pain is both the worst when she attempts to take a deep breath. 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

## 2023-01-29 ENCOUNTER — Other Ambulatory Visit: Payer: Self-pay | Admitting: Certified Nurse Midwife

## 2023-01-29 DIAGNOSIS — Z0130 Encounter for examination of blood pressure without abnormal findings: Secondary | ICD-10-CM

## 2023-01-30 ENCOUNTER — Encounter: Payer: Self-pay | Admitting: Nurse Practitioner

## 2023-01-30 ENCOUNTER — Ambulatory Visit (INDEPENDENT_AMBULATORY_CARE_PROVIDER_SITE_OTHER): Payer: Medicaid Other | Admitting: Nurse Practitioner

## 2023-01-30 ENCOUNTER — Ambulatory Visit: Payer: Medicaid Other | Admitting: Nurse Practitioner

## 2023-01-30 VITALS — BP 112/75 | HR 93 | Temp 97.1°F | Wt 215.0 lb

## 2023-01-30 DIAGNOSIS — F32A Depression, unspecified: Secondary | ICD-10-CM

## 2023-01-30 DIAGNOSIS — Z8679 Personal history of other diseases of the circulatory system: Secondary | ICD-10-CM | POA: Insufficient documentation

## 2023-01-30 DIAGNOSIS — F419 Anxiety disorder, unspecified: Secondary | ICD-10-CM | POA: Diagnosis not present

## 2023-01-30 MED ORDER — ESCITALOPRAM OXALATE 10 MG PO TABS
10.0000 mg | ORAL_TABLET | Freq: Every day | ORAL | 1 refills | Status: DC
Start: 2023-01-30 — End: 2023-04-15

## 2023-01-30 NOTE — Patient Instructions (Signed)
 1. Anxiety and depression (Primary)  - escitalopram  (LEXAPRO ) 10 MG tablet; Take 1 tablet (10 mg total) by mouth daily.  Dispense: 60 tablet; Refill: 1    It is important that you exercise regularly at least 30 minutes 5 times a week as tolerated  Think about what you will eat, plan ahead. Choose  clean, green, fresh or frozen over canned, processed or packaged foods which are more sugary, salty and fatty. 70 to 75% of food eaten should be vegetables and fruit. Three meals at set times with snacks allowed between meals, but they must be fruit or vegetables. Aim to eat over a 12 hour period , example 7 am to 7 pm, and STOP after  your last meal of the day. Drink water,generally about 64 ounces per day, no other drink is as healthy. Fruit juice is best enjoyed in a healthy way, by EATING the fruit.  [facility] for choosing [facility] we consider it a privelige to serve you.

## 2023-01-30 NOTE — Assessment & Plan Note (Signed)
 Blood pressure is normal in the office today There is no indication for starting blood pressure medication We will continue to monitor BP Readings from Last 3 Encounters:  01/30/23 112/75  01/26/23 125/79  06/04/22 112/87

## 2023-01-30 NOTE — Assessment & Plan Note (Signed)
  Flowsheet Row Office Visit from 01/30/2023 in [facility] - A Dept Of [facility]  PHQ-9 Total Score 3         start Lexapro  10 mg daily Patient denies SI, HI She is not interested in counseling  follow-up in the office in 6 weeks

## 2023-01-30 NOTE — Progress Notes (Signed)
 Established Patient Office Visit  Subjective:  Patient ID: Alvin Rubano, female    DOB: August 30, 2000  Age: 23 y.o. MRN: [id number]  CC:  Chief Complaint  Patient presents with   Shoulder Pain    Called EMS yesterday they gave her a EKG per patient it was normal, Hx of high blood pressure previous on Amlodipine  but took herself off of it.    Establish Care    HPI Nakhia Levitan is a 23 y.o. female  has a past medical history of Asthma, Depression, Headache, PCOS (polycystic ovarian syndrome), Sleeping difficulty (02/24/2018), Superficial thrombophlebitis in puerperium (03/15/2021), and Tension headache (02/24/2018).  Patient presents to establish care for her chronic medical conditions.   Anxiety and depression she reports some anxiety and depression.  She was currently Zoloft  in the past that made her feel somehow.  She does not know what is causing her anxiety and depression.  Stated that she lost her son  who was age 36 months old in 2023.  She has 2 other children.    Hypertension.  Patient was at the emergency room on [phone] 2025 with complaints of tingling sensation on her left side, chest pain, high blood pressure.  Stated that EKG obtained at the emergency department and labs were normal.  Blood pressure reading was also normal.  Patient left without being seen after triage..  Patient was on amlodipine  in the past but has not taken medication for months she currently denies chest pain, shortness of breath, numbness, tingling.          Past Medical History:  Diagnosis Date   Asthma    Depression    Headache    PCOS (polycystic ovarian syndrome)    Sleeping difficulty 02/24/2018   Superficial thrombophlebitis in puerperium 03/15/2021   Tension headache 02/24/2018    Past Surgical History:  Procedure Laterality Date   ADENOIDECTOMY     CESAREAN SECTION MULTI-GESTATIONAL N/A 03/10/2021   Procedure: CESAREAN SECTION MULTI-GESTATIONAL;  Surgeon: Herchel Gloris LABOR, MD;  Location: [facility] LD ORS;  Service: Obstetrics;  Laterality: N/A;   TONSILLECTOMY      Family History  Problem Relation Age of Onset   Hypertension Mother    Anxiety disorder Mother    Depression Mother    Hypertension Maternal Aunt    Anxiety disorder Maternal Grandmother    Depression Maternal Grandmother    Breast cancer Maternal Grandmother 78   Anxiety disorder Maternal Grandfather    Depression Maternal Grandfather    Diabetes Other    Cancer Other    Hypertension Other    Migraines Neg Hx    Seizures Neg Hx    Autism Neg Hx    ADD / ADHD Neg Hx    Bipolar disorder Neg Hx    Schizophrenia Neg Hx     Social History   Socioeconomic History   Marital status: Single    Spouse name: Not on file   Number of children: 2   Years of education: Not on file   Highest education level: 10th grade  Occupational History   Not on file  Tobacco Use   Smoking status: Never    Passive exposure: Yes   Smokeless tobacco: Never  Vaping Use   Vaping status: Never Used  Substance and Sexual Activity   Alcohol use: No   Drug use: Not Currently    Types: Marijuana    Comment: occasional - last smoked 11/03/21   Sexual activity: Not Currently  Birth control/protection: Condom  Other Topics Concern   Not on file  Social History Narrative   Lives with her children    Social Drivers of Health   Financial Resource Strain: Patient Declined (01/29/2023)   Overall Financial Resource Strain (CARDIA)    Difficulty of Paying Living Expenses: Patient declined  Food Insecurity: Patient Declined (01/29/2023)   Hunger Vital Sign    Worried About Running Out of Food in the Last Year: Patient declined    Ran Out of Food in the Last Year: Patient declined  Transportation Needs: Unmet Transportation Needs (01/29/2023)   PRAPARE - Administrator, Civil Service (Medical): Yes    Lack of Transportation (Non-Medical): Patient declined  Physical Activity: Sufficiently Active  (01/29/2023)   Exercise Vital Sign    Days of Exercise per Week: 3 days    Minutes of Exercise per Session: 150+ min  Recent Concern: Physical Activity - Inactive (11/24/2022)   Exercise Vital Sign    Days of Exercise per Week: 1 day    Minutes of Exercise per Session: 0 min  Stress: No Stress Concern Present (01/29/2023)   [employer] of Occupational Health - Occupational Stress Questionnaire    Feeling of Stress : Only a little  Recent Concern: Stress - Stress Concern Present (11/24/2022)   [employer] of Occupational Health - Occupational Stress Questionnaire    Feeling of Stress : To some extent  Social Connections: Moderately Integrated (01/29/2023)   Social Connection and Isolation Panel [NHANES]    Frequency of Communication with Friends and Family: Twice a week    Frequency of Social Gatherings with Friends and Family: Once a week    Attends Religious Services: 1 to 4 times per year    Active Member of [employer] or Organizations: No    Attends Engineer, Structural: Not on file    Marital Status: Living with partner  Intimate Partner Violence: Unknown (04/24/2021)   Received from [employer], [facility]   HITS    Physically Hurt: Not on file    Insult or Talk Down To: Not on file    Threaten Physical Harm: Not on file    Scream or Curse: Not on file    Outpatient Medications Prior to Visit  Medication Sig Dispense Refill   fluticasone  (FLOVENT  HFA) 110 MCG/ACT inhaler Inhale 2 puffs into the lungs in the morning and at bedtime. (Patient not taking: Reported on 05/31/2022) 1 each 12   amLODipine  (NORVASC ) 5 MG tablet Take 1 tablet (5 mg total) by mouth daily. (Patient not taking: Reported on 01/30/2023) 30 tablet 0   ferrous sulfate  325 (65 FE) MG EC tablet Take 1 tablet (325 mg total) by mouth every other day. (Patient not taking: Reported on 06/04/2022) 30 tablet 3   Prenatal Vit-Fe Fumarate-FA (PRENATAL VITAMINS) 28-0.8 MG TABS Take 1 tablet by mouth daily.  (Patient not taking: Reported on 01/30/2023) 30 tablet 10   senna-docusate (SENOKOT-S) 8.6-50 MG tablet Take 2 tablets by mouth daily as needed for mild constipation. (Patient not taking: Reported on 01/30/2023) 30 tablet 0   sertraline  (ZOLOFT ) 50 MG tablet Take 1 tablet (50 mg total) by mouth daily. (Patient not taking: Reported on 01/30/2023) 90 tablet 3   No facility-administered medications prior to visit.    No Known Allergies  ROS Review of Systems  Constitutional:  Negative for appetite change, chills, fatigue and fever.  HENT:  Negative for congestion, postnasal drip, rhinorrhea and sneezing.   Respiratory:  Negative for cough, shortness of breath and wheezing.   Cardiovascular:  Negative for chest pain, palpitations and leg swelling.  Gastrointestinal:  Negative for abdominal pain, constipation, nausea and vomiting.  Genitourinary:  Negative for difficulty urinating, dysuria, flank pain and frequency.  Musculoskeletal:  Negative for arthralgias, back pain, joint swelling and myalgias.  Skin:  Negative for color change, pallor, rash and wound.  Neurological:  Negative for dizziness, facial asymmetry, weakness, numbness and headaches.  Psychiatric/Behavioral:  Negative for behavioral problems, confusion, self-injury and suicidal ideas.       Objective:    Physical Exam Vitals and nursing note reviewed.  Constitutional:      General: She is not in acute distress.    Appearance: Normal appearance. She is not ill-appearing, toxic-appearing or diaphoretic.  HENT:     Mouth/Throat:     Mouth: Mucous membranes are moist.     Pharynx: Oropharynx is clear. No oropharyngeal exudate or posterior oropharyngeal erythema.  Eyes:     General: No scleral icterus.       Right eye: No discharge.        Left eye: No discharge.     Extraocular Movements: Extraocular movements intact.     Conjunctiva/sclera: Conjunctivae normal.  Cardiovascular:     Rate and Rhythm: Normal rate and regular  rhythm.     Pulses: Normal pulses.     Heart sounds: Normal heart sounds. No murmur heard.    No friction rub. No gallop.  Pulmonary:     Effort: Pulmonary effort is normal. No respiratory distress.     Breath sounds: Normal breath sounds. No stridor. No wheezing, rhonchi or rales.  Chest:     Chest wall: No tenderness.  Abdominal:     General: There is no distension.     Palpations: Abdomen is soft.     Tenderness: There is no abdominal tenderness. There is no right CVA tenderness, left CVA tenderness or guarding.  Musculoskeletal:        General: No swelling, tenderness, deformity or signs of injury.     Right lower leg: No edema.     Left lower leg: No edema.  Skin:    General: Skin is warm and dry.     Capillary Refill: Capillary refill takes less than 2 seconds.     Coloration: Skin is not jaundiced or pale.     Findings: No bruising, erythema or lesion.  Neurological:     Mental Status: She is alert and oriented to person, place, and time.     Motor: No weakness.     Coordination: Coordination normal.     Gait: Gait normal.  Psychiatric:        Mood and Affect: Mood normal.        Behavior: Behavior normal.        Thought Content: Thought content normal.        Judgment: Judgment normal.     BP 112/75   Pulse 93   Temp (!) 97.1 F (36.2 C)   Wt 215 lb (97.5 kg)   LMP 12/29/2022 (Approximate)   SpO2 100%   Breastfeeding No   BMI 36.90 kg/m  Wt Readings from Last 3 Encounters:  01/30/23 215 lb (97.5 kg)  01/26/23 216 lb (98 kg)  06/04/22 221 lb 8 oz (100.5 kg) (99%, Z= 2.23)*   * Growth percentiles are based on CDC (Girls, 2-20 Years) data.    Lab Results  Component Value Date   TSH 1.20 03/02/2018  Lab Results  Component Value Date   WBC 10.6 (H) 01/26/2023   HGB 12.3 01/26/2023   HCT 38.4 01/26/2023   MCV 84.8 01/26/2023   PLT 350 01/26/2023   Lab Results  Component Value Date   NA 135 01/26/2023   K 3.6 01/26/2023   CO2 22 01/26/2023    GLUCOSE 93 01/26/2023   BUN 10 01/26/2023   CREATININE 0.72 01/26/2023   BILITOT <0.2 06/04/2022   ALKPHOS 158 (H) 06/04/2022   AST 12 06/04/2022   ALT 11 06/04/2022   PROT 5.7 (L) 06/04/2022   ALBUMIN 3.3 (L) 06/04/2022   CALCIUM 9.1 01/26/2023   ANIONGAP 11 01/26/2023   EGFR 135 06/04/2022   No results found for: CHOL No results found for: HDL No results found for: LDLCALC No results found for: TRIG No results found for: CHOLHDL Lab Results  Component Value Date   HGBA1C 5.5 12/02/2021      Assessment & Plan:   Problem List Items Addressed This Visit       Other   Anxiety and depression - Primary    Flowsheet Row Office Visit from 01/30/2023 in [facility] Dept Of [facility]  PHQ-9 Total Score 3         start Lexapro  10 mg daily Patient denies SI, HI She is not interested in counseling  follow-up in the office in 6 weeks      Relevant Medications   escitalopram  (LEXAPRO ) 10 MG tablet   History of high blood pressure   Blood pressure is normal in the office today There is no indication for starting blood pressure medication We will continue to monitor BP Readings from Last 3 Encounters:  01/30/23 112/75  01/26/23 125/79  06/04/22 112/87          Meds ordered this encounter  Medications   escitalopram  (LEXAPRO ) 10 MG tablet    Sig: Take 1 tablet (10 mg total) by mouth daily.    Dispense:  60 tablet    Refill:  1    Follow-up: Return in about 6 weeks (around 03/13/2023) for ANXIETY, DEPRESSION.    Cammie Faulstich R Joy Haegele, FNP

## 2023-02-04 ENCOUNTER — Ambulatory Visit: Payer: Medicaid Other | Admitting: Nurse Practitioner

## 2023-03-13 ENCOUNTER — Ambulatory Visit (INDEPENDENT_AMBULATORY_CARE_PROVIDER_SITE_OTHER): Payer: Medicaid Other | Admitting: Nurse Practitioner

## 2023-03-13 ENCOUNTER — Encounter: Payer: Self-pay | Admitting: Nurse Practitioner

## 2023-03-13 VITALS — BP 110/68 | HR 69 | Temp 97.1°F | Wt 217.0 lb

## 2023-03-13 DIAGNOSIS — F419 Anxiety disorder, unspecified: Secondary | ICD-10-CM

## 2023-03-13 DIAGNOSIS — G43009 Migraine without aura, not intractable, without status migrainosus: Secondary | ICD-10-CM | POA: Diagnosis not present

## 2023-03-13 DIAGNOSIS — F32A Depression, unspecified: Secondary | ICD-10-CM

## 2023-03-13 MED ORDER — SUMATRIPTAN SUCCINATE 50 MG PO TABS
50.0000 mg | ORAL_TABLET | ORAL | 0 refills | Status: DC | PRN
Start: 2023-03-13 — End: 2023-10-23

## 2023-03-13 MED ORDER — TOPIRAMATE 25 MG PO TABS
25.0000 mg | ORAL_TABLET | Freq: Two times a day (BID) | ORAL | 1 refills | Status: DC
Start: 2023-03-13 — End: 2023-04-15

## 2023-03-13 NOTE — Assessment & Plan Note (Signed)
Will not restart amitriptyline due to risk of developing serotonin syndrome as the patient is also taking Lexapro   Start Topamax 25 mg daily for 1 week after 1 week increase to 25 mg twice daily Imitrex 50 mg as needed ordered Need for adequate sleep, hydration and rest discussed Follow-up in 4 weeks

## 2023-03-13 NOTE — Patient Instructions (Addendum)
1. Anxiety and depression (Primary)   2. Migraine without aura and without status migrainosus, not intractable  Take Topamax 25 mg daily for 7 days, after 7-day increase to 25 mg 2 times daily   - SUMAtriptan (IMITREX) 50 MG tablet; Take 1 tablet (50 mg total) by mouth every 2 (two) hours as needed for migraine. May repeat in 2 hours if headache persists or recurs.  Dispense: 10 tablet; Refill: 0 - topiramate (TOPAMAX) 25 MG tablet; Take 1 tablet (25 mg total) by mouth 2 (two) times daily.  Dispense: 60 tablet; Refill: 1   Drink at least 64 ounces of water daily to maintain hydration Get at least 8 hours of sleep nightly Take Tylenol 650 mg every 6 hours as needed for headache Occasionally take Imitrex 50 mg as needed  It is important that you exercise regularly at least 30 minutes 5 times a week as tolerated  Think about what you will eat, plan ahead. Choose " clean, green, fresh or frozen" over canned, processed or packaged foods which are more sugary, salty and fatty. 70 to 75% of food eaten should be vegetables and fruit. Three meals at set times with snacks allowed between meals, but they must be fruit or vegetables. Aim to eat over a 12 hour period , example 7 am to 7 pm, and STOP after  your last meal of the day. Drink water,generally about 64 ounces per day, no other drink is as healthy. Fruit juice is best enjoyed in a healthy way, by EATING the fruit.  Thanks for choosing [facility] we consider it a privelige to serve you.

## 2023-03-13 NOTE — Progress Notes (Signed)
Established Patient Office Visit  Subjective:  Patient ID: Alyssa Mcdaniel, female    DOB: March 17, 2000  Age: 23 y.o. MRN: [id number]  CC:  Chief Complaint  Patient presents with   Anxiety    HPI Alyssa Mcdaniel is a 23 y.o. female  has a past medical history of Asthma, Depression, Headache, PCOS (polycystic ovarian syndrome), Sleeping difficulty (02/24/2018), Superficial thrombophlebitis in puerperium (03/15/2021), and Tension headache (02/24/2018).  Patient presented for follow-up for anxiety and depression  Anxiety and depression.  Currently on Lexapro 10 mg daily, states that she is doing fine on the medication.  She denies SI, HI  Migraine headaches .patient complains of chronic migraine headaches, states that the headache is usually in the frontal and temporal area.  Headache is usually accompanied with nausea and light sensitivity.  She was on Topamax and amitriptyline in the past.  Takes Tylenol as needed that helps eases off her headache, staying in dark room also helps.  Headache usually last for about 45 minutes and then goes away, stated that she has been having daily headaches for about a year now.     Past Medical History:  Diagnosis Date   Asthma    Depression    Headache    PCOS (polycystic ovarian syndrome)    Sleeping difficulty 02/24/2018   Superficial thrombophlebitis in puerperium 03/15/2021   Tension headache 02/24/2018    Past Surgical History:  Procedure Laterality Date   ADENOIDECTOMY     CESAREAN SECTION MULTI-GESTATIONAL N/A 03/10/2021   Procedure: CESAREAN SECTION MULTI-GESTATIONAL;  Surgeon: Tereso Newcomer, MD;  Location: [facility];  Service: Obstetrics;  Laterality: N/A;   TONSILLECTOMY      Family History  Problem Relation Age of Onset   Hypertension Mother    Anxiety disorder Mother    Depression Mother    Hypertension Maternal Aunt    Anxiety disorder Maternal Grandmother    Depression Maternal Grandmother    Breast  cancer Maternal Grandmother 5   Anxiety disorder Maternal Grandfather    Depression Maternal Grandfather    Diabetes Other    Cancer Other    Hypertension Other    Migraines Neg Hx    Seizures Neg Hx    Autism Neg Hx    ADD / ADHD Neg Hx    Bipolar disorder Neg Hx    Schizophrenia Neg Hx     Social History   Socioeconomic History   Marital status: Single    Spouse name: Not on file   Number of children: 2   Years of education: Not on file   Highest education level: 10th grade  Occupational History   Not on file  Tobacco Use   Smoking status: Never    Passive exposure: Yes   Smokeless tobacco: Never  Vaping Use   Vaping status: Never Used  Substance and Sexual Activity   Alcohol use: No   Drug use: Not Currently    Types: Marijuana    Comment: occasional - last smoked 11/03/21   Sexual activity: Not Currently    Birth control/protection: Condom  Other Topics Concern   Not on file  Social History Narrative   Lives with her children    Social Drivers of Health   Financial Resource Strain: Patient Declined (01/29/2023)   Overall Financial Resource Strain (CARDIA)    Difficulty of Paying Living Expenses: Patient declined  Food Insecurity: Patient Declined (01/29/2023)   Hunger Vital Sign    Worried About Running Out of  Food in the Last Year: Patient declined    Ran Out of Food in the Last Year: Patient declined  Transportation Needs: Unmet Transportation Needs (01/29/2023)   PRAPARE - Administrator, Civil Service (Medical): Yes    Lack of Transportation (Non-Medical): Patient declined  Physical Activity: Sufficiently Active (01/29/2023)   Exercise Vital Sign    Days of Exercise per Week: 3 days    Minutes of Exercise per Session: 150+ min  Recent Concern: Physical Activity - Inactive (11/24/2022)   Exercise Vital Sign    Days of Exercise per Week: 1 day    Minutes of Exercise per Session: 0 min  Stress: No Stress Concern Present (01/29/2023)   [employer] of Occupational Health - Occupational Stress Questionnaire    Feeling of Stress : Only a little  Recent Concern: Stress - Stress Concern Present (11/24/2022)   [employer] of Occupational Health - Occupational Stress Questionnaire    Feeling of Stress : To some extent  Social Connections: Moderately Integrated (01/29/2023)   Social Connection and Isolation Panel [NHANES]    Frequency of Communication with Friends and Family: Twice a week    Frequency of Social Gatherings with Friends and Family: Once a week    Attends Religious Services: 1 to 4 times per year    Active Member of [employer] or Organizations: No    Attends Engineer, structural: Not on file    Marital Status: Living with partner  Intimate Partner Violence: Unknown (04/24/2021)   Received from [employer], [facility]   HITS    Physically Hurt: Not on file    Insult or Talk Down To: Not on file    Threaten Physical Harm: Not on file    Scream or Curse: Not on file    Outpatient Medications Prior to Visit  Medication Sig Dispense Refill   escitalopram (LEXAPRO) 10 MG tablet Take 1 tablet (10 mg total) by mouth daily. 60 tablet 1   fluticasone (FLOVENT HFA) 110 MCG/ACT inhaler Inhale 2 puffs into the lungs in the morning and at bedtime. (Patient not taking: Reported on 05/31/2022) 1 each 12   No facility-administered medications prior to visit.    No Known Allergies  ROS Review of Systems  Constitutional:  Negative for appetite change, chills, fatigue and fever.  HENT:  Negative for congestion, postnasal drip, rhinorrhea and sneezing.   Respiratory:  Negative for cough, shortness of breath and wheezing.   Cardiovascular:  Negative for chest pain, palpitations and leg swelling.  Gastrointestinal:  Negative for abdominal pain, constipation, nausea and vomiting.  Genitourinary:  Negative for difficulty urinating, dysuria, flank pain and frequency.  Musculoskeletal:  Negative for arthralgias, back  pain, joint swelling and myalgias.  Skin:  Negative for color change, pallor, rash and wound.  Neurological:  Negative for dizziness, facial asymmetry, weakness, numbness and headaches.  Psychiatric/Behavioral:  Negative for behavioral problems, confusion, self-injury and suicidal ideas.       Objective:    Physical Exam Vitals and nursing note reviewed.  Constitutional:      General: She is not in acute distress.    Appearance: Normal appearance. She is obese. She is not ill-appearing, toxic-appearing or diaphoretic.  HENT:     Mouth/Throat:     Mouth: Mucous membranes are moist.     Pharynx: Oropharynx is clear. No oropharyngeal exudate or posterior oropharyngeal erythema.  Eyes:     General: No scleral icterus.       Right  eye: No discharge.        Left eye: No discharge.     Extraocular Movements: Extraocular movements intact.     Conjunctiva/sclera: Conjunctivae normal.  Cardiovascular:     Rate and Rhythm: Normal rate and regular rhythm.     Pulses: Normal pulses.     Heart sounds: Normal heart sounds. No murmur heard.    No friction rub. No gallop.  Pulmonary:     Effort: Pulmonary effort is normal. No respiratory distress.     Breath sounds: Normal breath sounds. No stridor. No wheezing, rhonchi or rales.  Chest:     Chest wall: No tenderness.  Abdominal:     General: There is no distension.     Palpations: Abdomen is soft.     Tenderness: There is no abdominal tenderness. There is no right CVA tenderness, left CVA tenderness or guarding.  Musculoskeletal:        General: No swelling, tenderness, deformity or signs of injury.     Right lower leg: No edema.     Left lower leg: No edema.  Skin:    General: Skin is warm and dry.     Capillary Refill: Capillary refill takes less than 2 seconds.     Coloration: Skin is not jaundiced or pale.     Findings: No bruising, erythema or lesion.  Neurological:     Mental Status: She is alert and oriented to person, place,  and time.     Motor: No weakness.     Coordination: Coordination normal.     Gait: Gait normal.  Psychiatric:        Mood and Affect: Mood normal.        Behavior: Behavior normal.        Thought Content: Thought content normal.        Judgment: Judgment normal.     BP 110/68   Pulse 69   Temp (!) 97.1 F (36.2 C)   Wt 217 lb (98.4 kg)   SpO2 100%   BMI 37.25 kg/m  Wt Readings from Last 3 Encounters:  03/13/23 217 lb (98.4 kg)  01/30/23 215 lb (97.5 kg)  01/26/23 216 lb (98 kg)    Lab Results  Component Value Date   TSH 1.20 03/02/2018   Lab Results  Component Value Date   WBC 10.6 (H) 01/26/2023   HGB 12.3 01/26/2023   HCT 38.4 01/26/2023   MCV 84.8 01/26/2023   PLT 350 01/26/2023   Lab Results  Component Value Date   NA 135 01/26/2023   K 3.6 01/26/2023   CO2 22 01/26/2023   GLUCOSE 93 01/26/2023   BUN 10 01/26/2023   CREATININE 0.72 01/26/2023   BILITOT <0.2 06/04/2022   ALKPHOS 158 (H) 06/04/2022   AST 12 06/04/2022   ALT 11 06/04/2022   PROT 5.7 (L) 06/04/2022   ALBUMIN 3.3 (L) 06/04/2022   CALCIUM 9.1 01/26/2023   ANIONGAP 11 01/26/2023   EGFR 135 06/04/2022   No results found for: "CHOL" No results found for: "HDL" No results found for: "LDLCALC" No results found for: "TRIG" No results found for: "CHOLHDL" Lab Results  Component Value Date   HGBA1C 5.5 12/02/2021      Assessment & Plan:   Problem List Items Addressed This Visit       Cardiovascular and Mediastinum   Migraine without aura and without status migrainosus, not intractable   Will not restart amitriptyline due to risk of developing serotonin syndrome as the  patient is also taking Lexapro   Start Topamax 25 mg daily for 1 week after 1 week increase to 25 mg twice daily Imitrex 50 mg as needed ordered Need for adequate sleep, hydration and rest discussed Follow-up in 4 weeks         Relevant Medications   SUMAtriptan (IMITREX) 50 MG tablet   topiramate (TOPAMAX) 25  MG tablet     Other   Anxiety and depression - Primary      03/13/2023   11:34 AM 01/30/2023   11:58 AM 12/02/2021    2:13 PM  Depression screen PHQ 2/9  Decreased Interest 0 0 0  Down, Depressed, Hopeless 0 0 1  PHQ - 2 Score 0 0 1  Altered sleeping  1 0  Tired, decreased energy  1 1  Change in appetite  1 0  Feeling bad or failure about yourself   0 0  Trouble concentrating  0 0  Moving slowly or fidgety/restless  0 0  Suicidal thoughts  0 0  PHQ-9 Score  3 2  Difficult doing work/chores  Not difficult at all       03/13/2023   11:35 AM 01/30/2023   11:59 AM 12/02/2021    2:13 PM 03/15/2021   12:11 PM  GAD 7 : Generalized Anxiety Score  Nervous, Anxious, on Edge 1 1 1 1   Control/stop worrying 0 1 0 1  Worry too much - different things 0 1 0 2  Trouble relaxing 0 1 0 0  Restless 0 0 0 0  Easily annoyed or irritable 0 1 0 0  Afraid - awful might happen 0 0 0 0  Total GAD 7 Score 1 5 1 4   Anxiety Difficulty Not difficult at all Somewhat difficult    Continue Lexapro 10 mg daily Patient denies SI, HI          Meds ordered this encounter  Medications   SUMAtriptan (IMITREX) 50 MG tablet    Sig: Take 1 tablet (50 mg total) by mouth every 2 (two) hours as needed for migraine. May repeat in 2 hours if headache persists or recurs.    Dispense:  10 tablet    Refill:  0   topiramate (TOPAMAX) 25 MG tablet    Sig: Take 1 tablet (25 mg total) by mouth 2 (two) times daily.    Dispense:  60 tablet    Refill:  1    Follow-up: Return in about 4 weeks (around 04/10/2023) for migraine HA .    Donell Beers, FNP

## 2023-03-13 NOTE — Assessment & Plan Note (Addendum)
    03/13/2023   11:34 AM 01/30/2023   11:58 AM 12/02/2021    2:13 PM  Depression screen PHQ 2/9  Decreased Interest 0 0 0  Down, Depressed, Hopeless 0 0 1  PHQ - 2 Score 0 0 1  Altered sleeping  1 0  Tired, decreased energy  1 1  Change in appetite  1 0  Feeling bad or failure about yourself   0 0  Trouble concentrating  0 0  Moving slowly or fidgety/restless  0 0  Suicidal thoughts  0 0  PHQ-9 Score  3 2  Difficult doing work/chores  Not difficult at all       03/13/2023   11:35 AM 01/30/2023   11:59 AM 12/02/2021    2:13 PM 03/15/2021   12:11 PM  GAD 7 : Generalized Anxiety Score  Nervous, Anxious, on Edge 1 1 1 1   Control/stop worrying 0 1 0 1  Worry too much - different things 0 1 0 2  Trouble relaxing 0 1 0 0  Restless 0 0 0 0  Easily annoyed or irritable 0 1 0 0  Afraid - awful might happen 0 0 0 0  Total GAD 7 Score 1 5 1 4   Anxiety Difficulty Not difficult at all Somewhat difficult    Continue Lexapro 10 mg daily Patient denies SI, HI

## 2023-04-04 IMAGING — US US OB TRANSVAGINAL
1 series · 15 of 28 positions shown · non-contrast
Comparison: None.

CLINICAL DATA: Confirm twin viability.

EXAM:
US OB TRANSVAGINAL
TECHNIQUE: Transvaginal ultrasound was performed for complete evaluation of the
gestation as well as the maternal uterus, adnexal regions, and
pelvic cul-de-sac.

[Series 1: us ob transvaginal · 47 acquisitions, 15 frames shown]
[im 1/47]
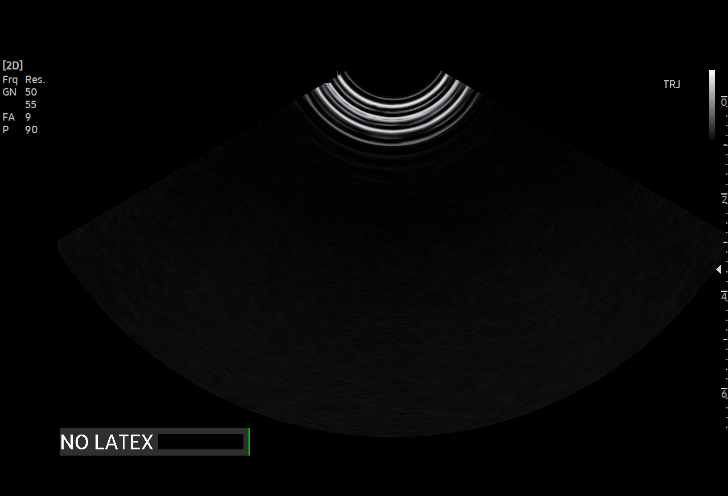
[im 4/47]
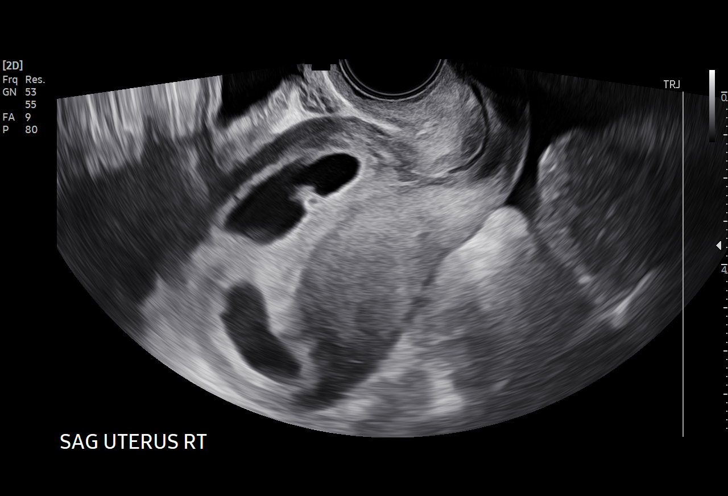
[im 7/47]
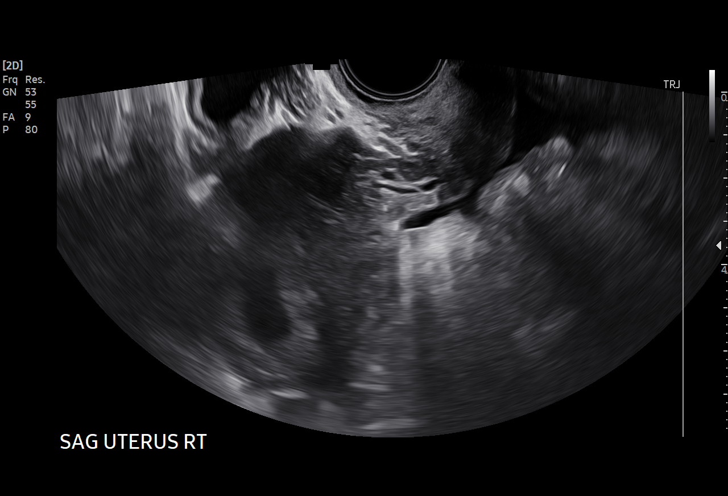
[im 11/47]
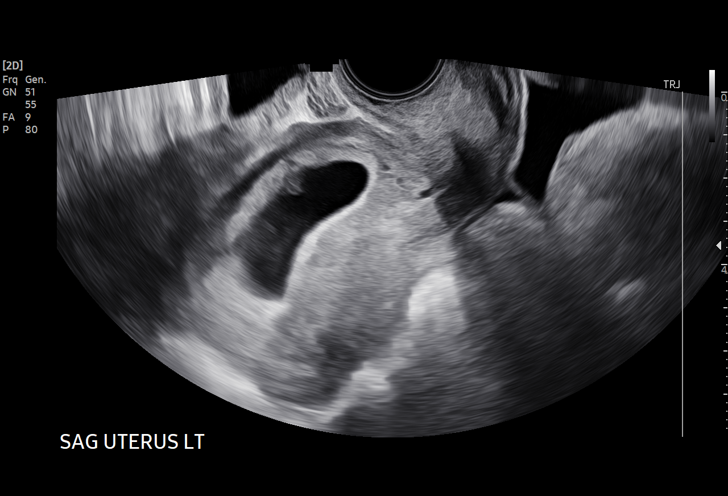
[im 14/47]
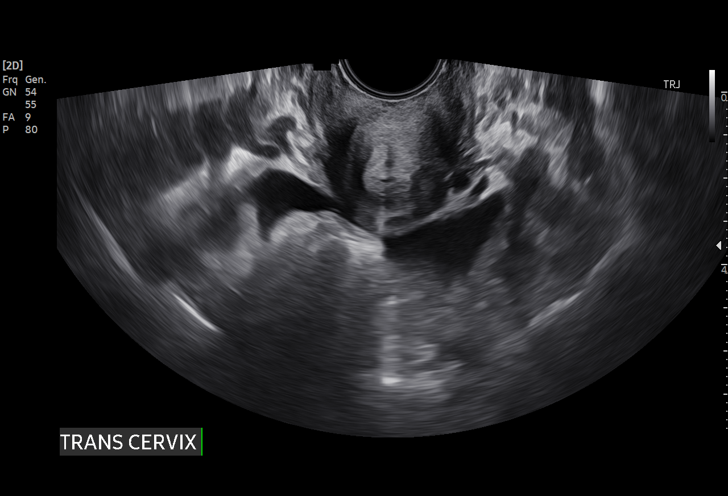
[im 18/47]
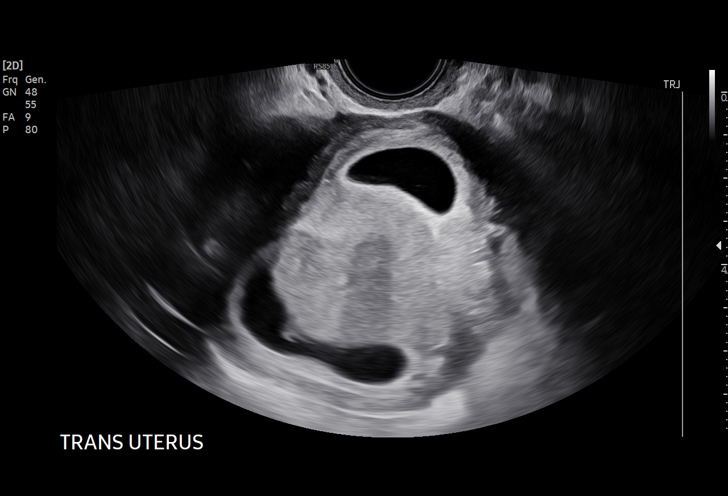
[im 21/47]
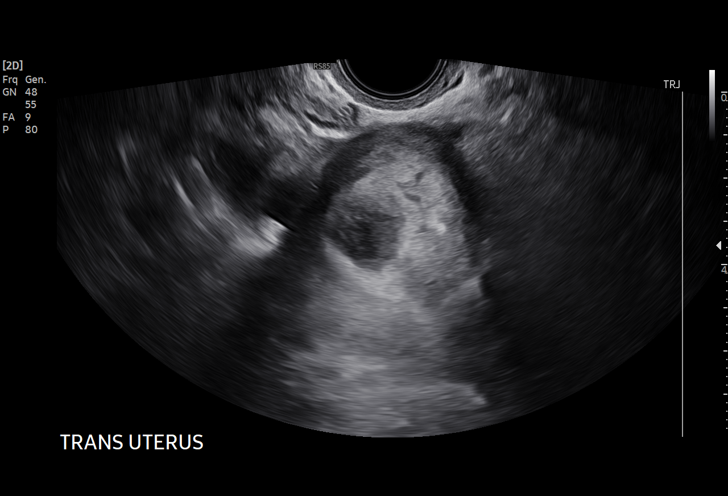
[im 24/47]
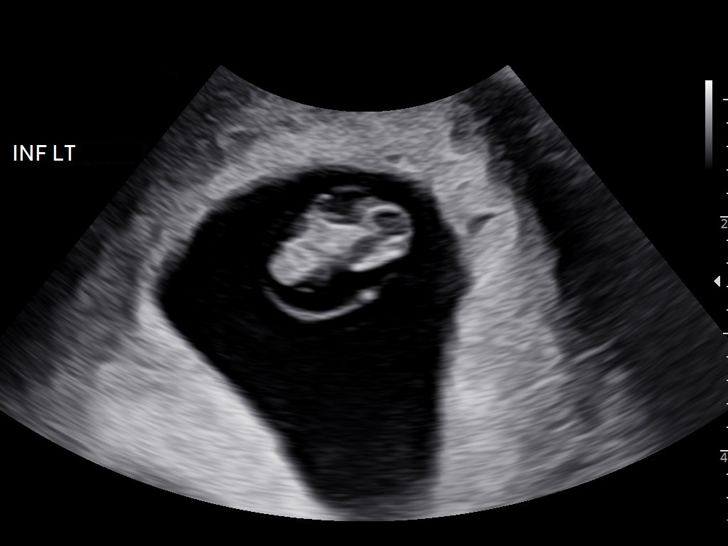
[im 26/47]
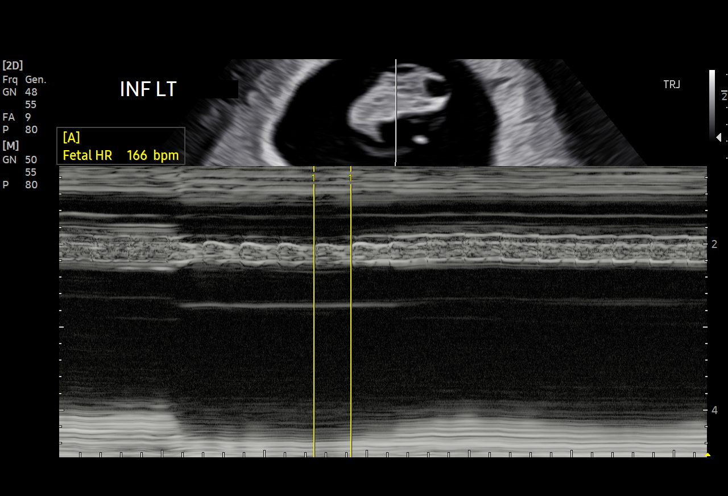
[im 29/47]
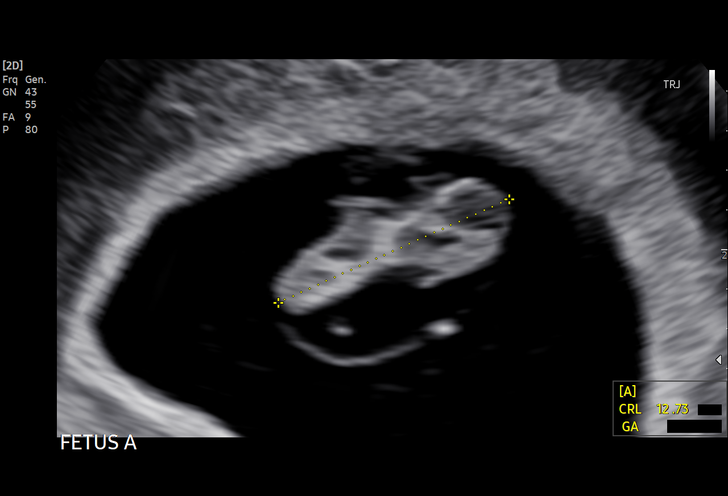
[im 33/47]
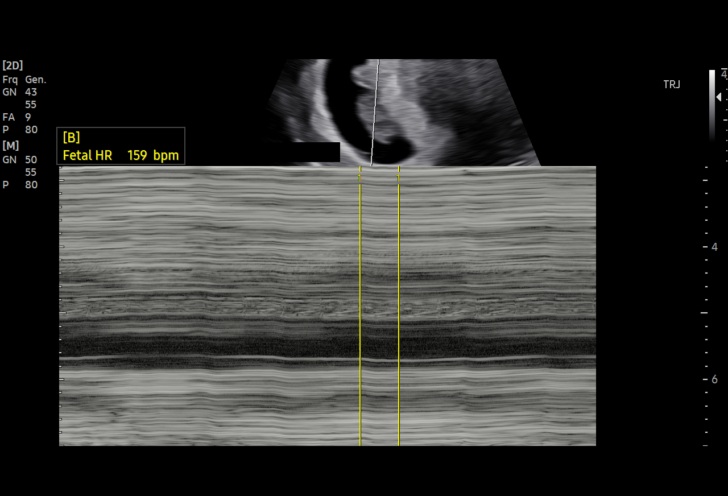
[im 36/47]
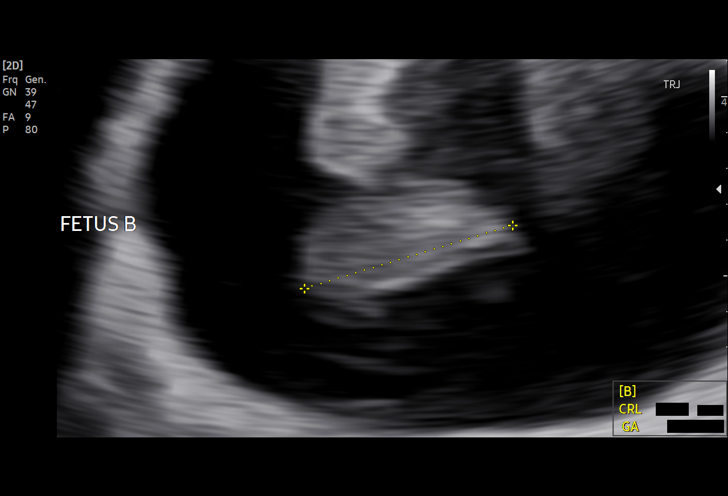
[im 40/47]
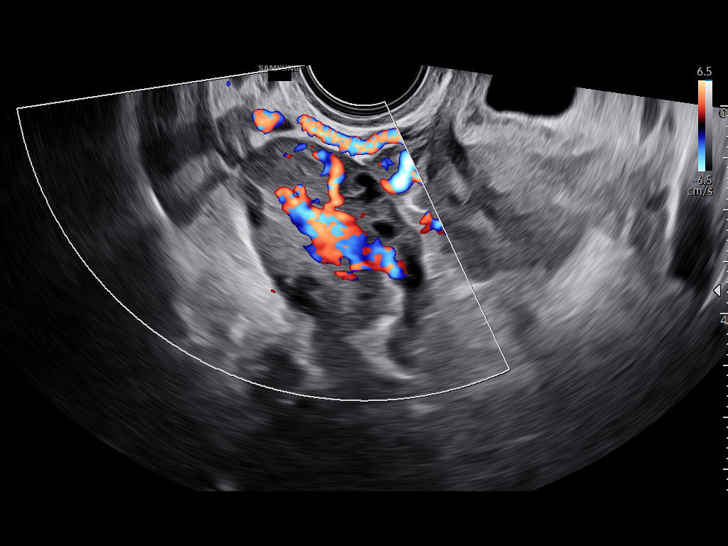
[im 43/47]
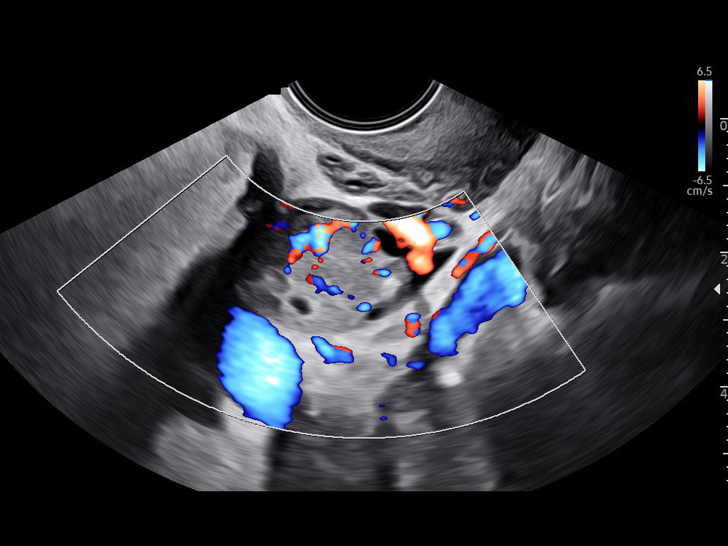
[im 47/47]
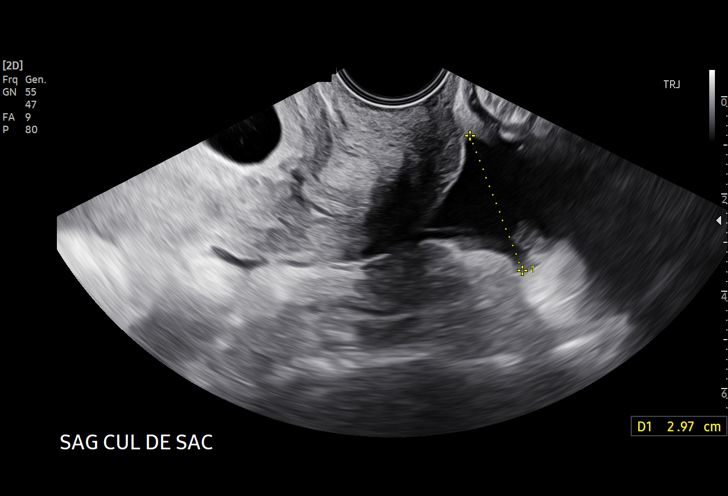

[15 of 28 positions shown; findings below may reference images not displayed]

FINDINGS: Number of IUPs:  2

Chorionicity/Amnionicity:  Dichorionic/Diamniotic.

TWIN 1

Yolk sac:  Present

Embryo:  Present

Cardiac Activity: Present

Heart Rate: 166 bpm

CRL:  12.7 mm   7 w 4 d                  US EDC: 06/06/2021

TWIN 2

Yolk sac:  Present

Embryo:  Present

Cardiac Activity: Present

Heart Rate: 159 bpm

MSD:   mm    w     d

CRL:  12.3 mm   7 w 3 d                  US EDC: 06/07/2021

Subchorionic hemorrhage:  None visualized.

Maternal uterus/adnexae: Corpus luteal cyst is noted in the right
ovary.
IMPRESSION: Expected interval growth twin gestation. Normal gestational sacs,
embryos and cardiac activity.

## 2023-04-15 ENCOUNTER — Ambulatory Visit (INDEPENDENT_AMBULATORY_CARE_PROVIDER_SITE_OTHER): Payer: Self-pay | Admitting: Nurse Practitioner

## 2023-04-15 ENCOUNTER — Encounter: Payer: Self-pay | Admitting: Nurse Practitioner

## 2023-04-15 VITALS — BP 117/75 | HR 81 | Temp 98.3°F | Wt 226.8 lb

## 2023-04-15 DIAGNOSIS — F32A Depression, unspecified: Secondary | ICD-10-CM

## 2023-04-15 DIAGNOSIS — F419 Anxiety disorder, unspecified: Secondary | ICD-10-CM

## 2023-04-15 DIAGNOSIS — G43009 Migraine without aura, not intractable, without status migrainosus: Secondary | ICD-10-CM

## 2023-04-15 MED ORDER — TOPIRAMATE 25 MG PO TABS: 25.0000 mg | ORAL_TABLET | Freq: Two times a day (BID) | ORAL | 1 refills | Status: DC

## 2023-04-15 MED ORDER — ESCITALOPRAM OXALATE 10 MG PO TABS: 10.0000 mg | ORAL_TABLET | Freq: Every day | ORAL | 1 refills | Status: DC

## 2023-04-15 NOTE — Assessment & Plan Note (Addendum)
 Flowsheet Row Office Visit from 01/30/2023 in [facility] - A Dept Of  [facility]  PHQ-9 Total Score 3     Well-controlled on Lexapro 10 mg daily Continue current medication

## 2023-04-15 NOTE — Progress Notes (Signed)
 Established Patient Office Visit  Subjective:  Patient ID: Alyssa Mcdaniel, female    DOB: 01-29-2000  Age: 23 y.o. MRN: [id number]  CC:  Chief Complaint  Patient presents with   Headache    Follow up on headaches Patient stated that she has not had any     HPI Alyssa Mcdaniel is a 23 y.o. female  has a past medical history of Asthma, Depression, Headache, PCOS (polycystic ovarian syndrome), Sleeping difficulty (02/24/2018), Superficial thrombophlebitis in puerperium (03/15/2021), and Tension headache (02/24/2018).  Patient presents for follow-up for migraine headaches  Migraine headaches taking Topamax 25 mg twice daily, Imitrex 50 mg as needed.  Stated that her headache has been well controlled, only had 1 headache since her last visit  Anxiety and depression.  Currently on Lexapro 10 mg daily, denies SI, HI      Past Medical History:  Diagnosis Date   Asthma    Depression    Headache    PCOS (polycystic ovarian syndrome)    Sleeping difficulty 02/24/2018   Superficial thrombophlebitis in puerperium 03/15/2021   Tension headache 02/24/2018    Past Surgical History:  Procedure Laterality Date   ADENOIDECTOMY     CESAREAN SECTION MULTI-GESTATIONAL N/A 03/10/2021   Procedure: CESAREAN SECTION MULTI-GESTATIONAL;  Surgeon: Tereso Newcomer, MD;  Location: [facility] LD [facility];  Service: Obstetrics;  Laterality: N/A;   TONSILLECTOMY      Family History  Problem Relation Age of Onset   Hypertension Mother    Anxiety disorder Mother    Depression Mother    Hypertension Maternal Aunt    Anxiety disorder Maternal Grandmother    Depression Maternal Grandmother    Breast cancer Maternal Grandmother 47   Anxiety disorder Maternal Grandfather    Depression Maternal Grandfather    Diabetes Other    Cancer Other    Hypertension Other    Migraines Neg Hx    Seizures Neg Hx    Autism Neg Hx    ADD / ADHD Neg Hx    Bipolar disorder Neg Hx    Schizophrenia Neg Hx      Social History   Socioeconomic History   Marital status: Single    Spouse name: Not on file   Number of children: 2   Years of education: Not on file   Highest education level: 10th grade  Occupational History   Not on file  Tobacco Use   Smoking status: Never    Passive exposure: Yes   Smokeless tobacco: Never  Vaping Use   Vaping status: Never Used  Substance and Sexual Activity   Alcohol use: No   Drug use: Not Currently    Types: Marijuana    Comment: occasional - last smoked 11/03/21   Sexual activity: Not Currently    Birth control/protection: Condom  Other Topics Concern   Not on file  Social History Narrative   Lives with her children    Social Drivers of Health   Financial Resource Strain: Patient Declined (01/29/2023)   Overall Financial Resource Strain (CARDIA)    Difficulty of Paying Living Expenses: Patient declined  Food Insecurity: Patient Declined (01/29/2023)   Hunger Vital Sign    Worried About Running Out of Food in the Last Year: Patient declined    Ran Out of Food in the Last Year: Patient declined  Transportation Needs: Unmet Transportation Needs (01/29/2023)   PRAPARE - Administrator, Civil Service (Medical): Yes    Lack of Transportation (Non-Medical):  Patient declined  Physical Activity: Sufficiently Active (01/29/2023)   Exercise Vital Sign    Days of Exercise per Week: 3 days    Minutes of Exercise per Session: 150+ min  Recent Concern: Physical Activity - Inactive (11/24/2022)   Exercise Vital Sign    Days of Exercise per Week: 1 day    Minutes of Exercise per Session: 0 min  Stress: No Stress Concern Present (01/29/2023)   [employer] of Occupational Health - Occupational Stress Questionnaire    Feeling of Stress : Only a little  Recent Concern: Stress - Stress Concern Present (11/24/2022)   [employer] of Occupational Health - Occupational Stress Questionnaire    Feeling of Stress : To some extent  Social  Connections: Moderately Integrated (01/29/2023)   Social Connection and Isolation Panel [NHANES]    Frequency of Communication with Friends and Family: Twice a week    Frequency of Social Gatherings with Friends and Family: Once a week    Attends Religious Services: 1 to 4 times per year    Active Member of [employer] or Organizations: No    Attends Engineer, structural: Not on file    Marital Status: Living with partner  Intimate Partner Violence: Unknown (04/24/2021)   Received from [employer], [facility]   HITS    Physically Hurt: Not on file    Insult or Talk Down To: Not on file    Threaten Physical Harm: Not on file    Scream or Curse: Not on file    Outpatient Medications Prior to Visit  Medication Sig Dispense Refill   SUMAtriptan (IMITREX) 50 MG tablet Take 1 tablet (50 mg total) by mouth every 2 (two) hours as needed for migraine. May repeat in 2 hours if headache persists or recurs. 10 tablet 0   escitalopram (LEXAPRO) 10 MG tablet Take 1 tablet (10 mg total) by mouth daily. 60 tablet 1   topiramate (TOPAMAX) 25 MG tablet Take 1 tablet (25 mg total) by mouth 2 (two) times daily. 60 tablet 1   fluticasone (FLOVENT HFA) 110 MCG/ACT inhaler Inhale 2 puffs into the lungs in the morning and at bedtime. (Patient not taking: Reported on 05/31/2022) 1 each 12   No facility-administered medications prior to visit.    No Known Allergies  ROS Review of Systems  Constitutional:  Negative for appetite change, chills, fatigue and fever.  HENT:  Negative for congestion, postnasal drip, rhinorrhea and sneezing.   Respiratory:  Negative for cough, shortness of breath and wheezing.   Cardiovascular:  Negative for chest pain, palpitations and leg swelling.  Gastrointestinal:  Negative for abdominal pain, constipation, nausea and vomiting.  Genitourinary:  Negative for difficulty urinating, dysuria, flank pain and frequency.  Musculoskeletal:  Negative for arthralgias, back pain,  joint swelling and myalgias.  Skin:  Negative for color change, pallor, rash and wound.  Neurological:  Negative for dizziness, facial asymmetry, weakness, numbness and headaches.  Psychiatric/Behavioral:  Negative for behavioral problems, confusion, self-injury and suicidal ideas.       Objective:    Physical Exam Vitals and nursing note reviewed.  Constitutional:      General: She is not in acute distress.    Appearance: Normal appearance. She is obese. She is not ill-appearing, toxic-appearing or diaphoretic.  HENT:     Mouth/Throat:     Mouth: Mucous membranes are moist.     Pharynx: Oropharynx is clear. No oropharyngeal exudate or posterior oropharyngeal erythema.  Eyes:  General: No scleral icterus.       Right eye: No discharge.        Left eye: No discharge.     Extraocular Movements: Extraocular movements intact.     Conjunctiva/sclera: Conjunctivae normal.  Cardiovascular:     Rate and Rhythm: Normal rate and regular rhythm.     Pulses: Normal pulses.     Heart sounds: Normal heart sounds. No murmur heard.    No friction rub. No gallop.  Pulmonary:     Effort: Pulmonary effort is normal. No respiratory distress.     Breath sounds: Normal breath sounds. No stridor. No wheezing, rhonchi or rales.  Chest:     Chest wall: No tenderness.  Abdominal:     General: There is no distension.     Palpations: Abdomen is soft.     Tenderness: There is no abdominal tenderness. There is no right CVA tenderness, left CVA tenderness or guarding.  Musculoskeletal:        General: No swelling, tenderness, deformity or signs of injury.     Right lower leg: No edema.     Left lower leg: No edema.  Skin:    General: Skin is warm and dry.     Capillary Refill: Capillary refill takes less than 2 seconds.     Coloration: Skin is not jaundiced or pale.     Findings: No bruising, erythema or lesion.  Neurological:     Mental Status: She is alert and oriented to person, place, and  time.     Motor: No weakness.     Coordination: Coordination normal.     Gait: Gait normal.  Psychiatric:        Mood and Affect: Mood normal.        Behavior: Behavior normal.        Thought Content: Thought content normal.        Judgment: Judgment normal.     BP 117/75   Pulse 81   Temp 98.3 F (36.8 C) (Oral)   Wt 226 lb 12.8 oz (102.9 kg)   SpO2 99%   BMI 38.93 kg/m  Wt Readings from Last 3 Encounters:  04/15/23 226 lb 12.8 oz (102.9 kg)  03/13/23 217 lb (98.4 kg)  01/30/23 215 lb (97.5 kg)    Lab Results  Component Value Date   TSH 1.20 03/02/2018   Lab Results  Component Value Date   WBC 10.6 (H) 01/26/2023   HGB 12.3 01/26/2023   HCT 38.4 01/26/2023   MCV 84.8 01/26/2023   PLT 350 01/26/2023   Lab Results  Component Value Date   NA 135 01/26/2023   K 3.6 01/26/2023   CO2 22 01/26/2023   GLUCOSE 93 01/26/2023   BUN 10 01/26/2023   CREATININE 0.72 01/26/2023   BILITOT <0.2 06/04/2022   ALKPHOS 158 (H) 06/04/2022   AST 12 06/04/2022   ALT 11 06/04/2022   PROT 5.7 (L) 06/04/2022   ALBUMIN 3.3 (L) 06/04/2022   CALCIUM 9.1 01/26/2023   ANIONGAP 11 01/26/2023   EGFR 135 06/04/2022   No results found for: "CHOL" No results found for: "HDL" No results found for: "LDLCALC" No results found for: "TRIG" No results found for: "CHOLHDL" Lab Results  Component Value Date   HGBA1C 5.5 12/02/2021      Assessment & Plan:   Problem List Items Addressed This Visit       Cardiovascular and Mediastinum   Migraine without aura and without status migrainosus, not intractable  Relevant Medications   topiramate (TOPAMAX) 25 MG tablet   escitalopram (LEXAPRO) 10 MG tablet     Other   Anxiety and depression - Primary   Flowsheet Row Office Visit from 01/30/2023 in [facility] - [facility] Dept Of [facility]  PHQ-9 Total Score 3     Well-controlled on Lexapro 10 mg daily Continue current medication      Relevant  Medications   escitalopram (LEXAPRO) 10 MG tablet    Meds ordered this encounter  Medications   topiramate (TOPAMAX) 25 MG tablet    Sig: Take 1 tablet (25 mg total) by mouth 2 (two) times daily.    Dispense:  180 tablet    Refill:  1   escitalopram (LEXAPRO) 10 MG tablet    Sig: Take 1 tablet (10 mg total) by mouth daily.    Dispense:  90 tablet    Refill:  1    Follow-up: Return in about 7 months (around 11/15/2023) for CPE.    Donell Beers, FNP

## 2023-04-15 NOTE — Patient Instructions (Signed)
 1. Anxiety and depression (Primary)  - escitalopram (LEXAPRO) 10 MG tablet; Take 1 tablet (10 mg total) by mouth daily.  Dispense: 90 tablet; Refill: 1  2. Migraine without aura and without status migrainosus, not intractable  - topiramate (TOPAMAX) 25 MG tablet; Take 1 tablet (25 mg total) by mouth 2 (two) times daily.  Dispense: 180 tablet; Refill: 1    It is important that you exercise regularly at least 30 minutes 5 times a week as tolerated  Think about what you will eat, plan ahead. Choose " clean, green, fresh or frozen" over canned, processed or packaged foods which are more sugary, salty and fatty. 70 to 75% of food eaten should be vegetables and fruit. Three meals at set times with snacks allowed between meals, but they must be fruit or vegetables. Aim to eat over a 12 hour period , example 7 am to 7 pm, and STOP after  your last meal of the day. Drink water,generally about 64 ounces per day, no other drink is as healthy. Fruit juice is best enjoyed in a healthy way, by EATING the fruit.  Thanks for choosing [facility] we consider it a privelige to serve you.

## 2023-10-23 ENCOUNTER — Encounter: Payer: Self-pay | Admitting: Nurse Practitioner

## 2023-10-23 ENCOUNTER — Ambulatory Visit (INDEPENDENT_AMBULATORY_CARE_PROVIDER_SITE_OTHER): Payer: Self-pay | Admitting: Nurse Practitioner

## 2023-10-23 ENCOUNTER — Other Ambulatory Visit (HOSPITAL_COMMUNITY)
Admission: RE | Admit: 2023-10-23 | Discharge: 2023-10-23 | Disposition: A | Source: Ambulatory Visit | Attending: Nurse Practitioner | Admitting: Nurse Practitioner

## 2023-10-23 VITALS — BP 120/52 | HR 60 | Wt 221.0 lb

## 2023-10-23 DIAGNOSIS — Z1329 Encounter for screening for other suspected endocrine disorder: Secondary | ICD-10-CM | POA: Diagnosis not present

## 2023-10-23 DIAGNOSIS — Z130 Encounter for screening for diseases of the blood and blood-forming organs and certain disorders involving the immune mechanism: Secondary | ICD-10-CM

## 2023-10-23 DIAGNOSIS — Z124 Encounter for screening for malignant neoplasm of cervix: Secondary | ICD-10-CM | POA: Insufficient documentation

## 2023-10-23 DIAGNOSIS — F419 Anxiety disorder, unspecified: Secondary | ICD-10-CM

## 2023-10-23 DIAGNOSIS — G43009 Migraine without aura, not intractable, without status migrainosus: Secondary | ICD-10-CM

## 2023-10-23 DIAGNOSIS — Z113 Encounter for screening for infections with a predominantly sexual mode of transmission: Secondary | ICD-10-CM | POA: Diagnosis not present

## 2023-10-23 DIAGNOSIS — Z13228 Encounter for screening for other metabolic disorders: Secondary | ICD-10-CM

## 2023-10-23 DIAGNOSIS — Z0000 Encounter for general adult medical examination without abnormal findings: Secondary | ICD-10-CM | POA: Diagnosis not present

## 2023-10-23 DIAGNOSIS — F32A Depression, unspecified: Secondary | ICD-10-CM

## 2023-10-23 DIAGNOSIS — E669 Obesity, unspecified: Secondary | ICD-10-CM | POA: Insufficient documentation

## 2023-10-23 DIAGNOSIS — Z720 Tobacco use: Secondary | ICD-10-CM | POA: Insufficient documentation

## 2023-10-23 DIAGNOSIS — Z1321 Encounter for screening for nutritional disorder: Secondary | ICD-10-CM

## 2023-10-23 MED ORDER — AMITRIPTYLINE HCL 25 MG PO TABS: 25.0000 mg | ORAL_TABLET | Freq: Every day | ORAL | 1 refills | Status: DC

## 2023-10-23 MED ORDER — SUMATRIPTAN SUCCINATE 50 MG PO TABS: 50.0000 mg | ORAL_TABLET | ORAL | 0 refills | Status: AC | PRN

## 2023-10-23 NOTE — Assessment & Plan Note (Signed)
 Flowsheet Row Office Visit from 10/23/2023 in [facility] - A Dept Of [facility]  PHQ-9 Total Score 5       10/23/2023    1:47 PM 04/15/2023    2:14 PM 03/13/2023   11:35 AM 01/30/2023   11:59 AM  GAD 7 : Generalized Anxiety Score  Nervous, Anxious, on Edge 1 0 1 1  Control/stop worrying 1 0 0 1  Worry too much - different things 2 0 0 1  Trouble relaxing 2 0 0 1  Restless 1 0 0 0  Easily annoyed or irritable 3 0 0 1  Afraid - awful might happen 1 0 0 0  Total GAD 7 Score 11 0 1 5  Anxiety Difficulty Somewhat difficult Not difficult at all Not difficult at all Somewhat difficult   Anxiety and depression likely related to past trauma. Prefers counseling before medication. - Refer to social worker for counseling - Consider starting new medication for anxiety and depression after counseling. Starting amitriptyline  for migraine

## 2023-10-23 NOTE — Assessment & Plan Note (Signed)
 Wt Readings from Last 3 Encounters:  10/23/23 221 lb (100.2 kg)  04/15/23 226 lb 12.8 oz (102.9 kg)  03/13/23 217 lb (98.4 kg)   Body mass index is 37.93 kg/m.   Discussed dietary modifications focusing on vegetables and protein and less carbohydrates  - Encourage regular moderate to vigorous exercises at least  150 minutes weekly as tolerated and dietary modifications.

## 2023-10-23 NOTE — Assessment & Plan Note (Signed)
 Smoking cessation encouraged!

## 2023-10-23 NOTE — Assessment & Plan Note (Signed)
 Annual exam as documented.  Counseling done include healthy lifestyle involving committing to 150 minutes of exercise per week, heart healthy diet, and attaining healthy weight. The importance of adequate sleep also discussed.  Changes in health habits are decided on by patient with goals and time frames set for achieving them. Immunization and cancer screening  needs are specifically addressed at this visit.     - Performed Pap smear. - Performed STD screening . - Perform lipid panel, BMP, CBC. - Discuss benefits of flu vaccine.

## 2023-10-23 NOTE — Progress Notes (Signed)
 Complete physical exam  Patient: Alyssa Mcdaniel   DOB: [birth year]   23 y.o. Female  MRN: [id number]  Subjective:    Chief Complaint  Patient presents with  . Annual Exam    fasting     Discussed the use of AI scribe software for clinical note transcription with the patient, who gave verbal consent to proceed.  History of Present Illness Alyssa Mcdaniel is a 23 year old female  has a past medical history of Anxiety, Asthma, Depression, Headache, PCOS (polycystic ovarian syndrome), Sleeping difficulty (02/24/2018), Superficial thrombophlebitis in puerperium (03/15/2021), and Tension headache (02/24/2018).  who presents for an annual physical exam.  She experiences daily migraines characterized by a pressure sensation in specific areas of her head, persisting for about a month. She previously used Topamax  but stopped taking it. Currently, she takes Imitrex  as needed for severe migraines, which provides some relief when combined with ibuprofen . She has also tried amitriptyline  and another medication she cannot recall, but is unsure why she stopped taking them. Her migraines subside with medication but eventually return.  She has a history of anxiety and depression, which she attributes to the passing of her son and other past events. She previously took Lexapro  but discontinued it due to adverse effects on her mood and interactions with her children. She wants counseling, particularly with a therapist who understands her cultural background, but is hesitant due to past negative experiences with counseling.  She was diagnosed with PCOS at a younger age, which she believes contributes to her difficulty with weight management. She mentions a history of using Nexplanon, which she feels also contributed to weight gain. She struggles with maintaining a regular eating schedule due to her busy lifestyle, often skipping meals and snacking instead.  She has a social history of occasional  alcohol consumption since turning 23 and infrequent use of 'Black and Mild' cigars. She lives with her two children, having lost a third child. She does not use drugs.  Assessment & Plan     Most recent fall risk assessment:     No data to display           Most recent depression screenings:    10/23/2023    1:44 PM 04/15/2023    2:14 PM  PHQ 2/9 Scores  PHQ - 2 Score 1 0  PHQ- 9 Score 5         Patient Care Team: Nixon Sparr R, FNP as PCP - General (Nurse Practitioner) Lorence Ozell CROME, MD (Inactive) as PCP - OBGYN (Obstetrics and Gynecology)   Outpatient Medications Prior to Visit  Medication Sig  . [DISCONTINUED] SUMAtriptan  (IMITREX ) 50 MG tablet Take 1 tablet (50 mg total) by mouth every 2 (two) hours as needed for migraine. May repeat in 2 hours if headache persists or recurs.  . fluticasone  (FLOVENT  HFA) 110 MCG/ACT inhaler Inhale 2 puffs into the lungs in the morning and at bedtime. (Patient not taking: Reported on 10/23/2023)  . [DISCONTINUED] escitalopram  (LEXAPRO ) 10 MG tablet Take 1 tablet (10 mg total) by mouth daily. (Patient not taking: Reported on 10/23/2023)  . [DISCONTINUED] topiramate  (TOPAMAX ) 25 MG tablet Take 1 tablet (25 mg total) by mouth 2 (two) times daily. (Patient not taking: Reported on 10/23/2023)   No facility-administered medications prior to visit.    Review of Systems  Constitutional:  Negative for activity change, appetite change, fatigue and fever.  HENT:  Negative for congestion, dental problem and ear discharge.   Eyes:  Negative for pain, discharge and itching.  Respiratory:  Negative for cough, chest tightness, shortness of breath and wheezing.   Cardiovascular:  Negative for chest pain, palpitations and leg swelling.  Gastrointestinal:  Negative for blood in stool, diarrhea, rectal pain and vomiting.  Endocrine: Negative for cold intolerance, heat intolerance and polydipsia.  Genitourinary:  Negative for difficulty urinating,  dysuria, flank pain, frequency, hematuria, menstrual problem, pelvic pain and vaginal bleeding.  Musculoskeletal:  Negative for arthralgias, back pain and gait problem.  Skin:  Negative for color change, pallor, rash and wound.  Allergic/Immunologic: Negative for environmental allergies and food allergies.  Neurological:  Positive for headaches. Negative for dizziness, tremors, facial asymmetry and weakness.  Hematological:  Negative for adenopathy. Does not bruise/bleed easily.  Psychiatric/Behavioral:  Negative for agitation, behavioral problems, hallucinations, self-injury and suicidal ideas.        Objective:     BP (!) 120/52   Pulse 60   Wt 221 lb (100.2 kg)   SpO2 100%   BMI 37.93 kg/m    Physical Exam Vitals and nursing note reviewed. Exam conducted with a chaperone present.  Constitutional:      General: She is not in acute distress.    Appearance: Normal appearance. She is obese. She is not ill-appearing, toxic-appearing or diaphoretic.  HENT:     Right Ear: Tympanic membrane, ear canal and external ear normal. There is no impacted cerumen.     Left Ear: Tympanic membrane, ear canal and external ear normal. There is no impacted cerumen.     Nose: Nose normal. No congestion or rhinorrhea.     Mouth/Throat:     Mouth: Mucous membranes are moist.     Pharynx: Oropharynx is clear. No oropharyngeal exudate or posterior oropharyngeal erythema.  Eyes:     General: No scleral icterus.       Right eye: No discharge.        Left eye: No discharge.     Extraocular Movements: Extraocular movements intact.     Conjunctiva/sclera: Conjunctivae normal.  Neck:     Vascular: No carotid bruit.  Cardiovascular:     Rate and Rhythm: Normal rate and regular rhythm.     Pulses: Normal pulses.     Heart sounds: Normal heart sounds. No murmur heard.    No friction rub. No gallop.  Pulmonary:     Effort: Pulmonary effort is normal. No respiratory distress.     Breath sounds: Normal  breath sounds. No stridor. No wheezing, rhonchi or rales.  Chest:     Chest wall: No mass, lacerations, deformity, swelling, tenderness or edema.  Breasts:    Tanner Score is 5.     Breasts are symmetrical.     Right: Normal. No swelling, bleeding, inverted nipple, mass, nipple discharge, skin change or tenderness.     Left: Normal. No swelling, bleeding, inverted nipple, mass, nipple discharge, skin change or tenderness.  Abdominal:     General: Bowel sounds are normal. There is no distension.     Palpations: Abdomen is soft. There is no mass.     Tenderness: There is no abdominal tenderness. There is no right CVA tenderness, left CVA tenderness, guarding or rebound.     Hernia: No hernia is present. There is no hernia in the left inguinal area or right inguinal area.  Genitourinary:    General: Normal vulva.     Exam position: Lithotomy position.     Pubic Area: No rash or pubic lice.  Tanner stage (genital): 5.     Labia:        Right: No rash, tenderness, lesion or injury.        Left: No rash, tenderness, lesion or injury.      Urethra: No prolapse, urethral pain, urethral swelling or urethral lesion.     Vagina: No signs of injury and foreign body. No vaginal discharge, erythema, tenderness, bleeding, lesions or prolapsed vaginal walls.     Cervix: No cervical motion tenderness, discharge, friability, lesion, erythema, cervical bleeding or eversion.     Uterus: Normal. Not enlarged, not fixed, not tender and no uterine prolapse.      Adnexa:        Right: No mass, tenderness or fullness.         Left: No mass, tenderness or fullness.    Musculoskeletal:        General: No swelling, tenderness, deformity or signs of injury.     Cervical back: Normal range of motion and neck supple. No rigidity or tenderness.     Right lower leg: No edema.     Left lower leg: No edema.  Lymphadenopathy:     Cervical: No cervical adenopathy.     Upper Body:     Right upper body: No  supraclavicular, axillary or pectoral adenopathy.     Left upper body: No supraclavicular, axillary or pectoral adenopathy.     Lower Body: No right inguinal adenopathy. No left inguinal adenopathy.  Skin:    General: Skin is warm and dry.     Capillary Refill: Capillary refill takes less than 2 seconds.     Coloration: Skin is not jaundiced or pale.     Findings: No bruising, erythema, lesion or rash.  Neurological:     Mental Status: She is alert and oriented to person, place, and time.     GCS: GCS eye subscore is 4. GCS verbal subscore is 5. GCS motor subscore is 6.     Cranial Nerves: No cranial nerve deficit, dysarthria or facial asymmetry.     Sensory: No sensory deficit.     Motor: No weakness.     Coordination: Coordination normal.     Gait: Gait normal.     Deep Tendon Reflexes: Reflexes normal.  Psychiatric:        Mood and Affect: Mood normal.        Behavior: Behavior normal.        Thought Content: Thought content normal.        Judgment: Judgment normal.     No results found for any visits on 10/23/23.     Assessment & Plan:    Routine Health Maintenance and Physical Exam  Immunization History  Administered Date(s) Administered  . Tdap 04/28/2022    Health Maintenance  Topic Date Due  . HPV VACCINES (1 - 3-dose series) Never done  . Meningococcal B Vaccine (1 of 2 - Standard) Never done  . Pneumococcal Vaccine (1 of 2 - PCV) 10/18/2021  . CHLAMYDIA SCREENING  05/19/2023  . Influenza Vaccine  08/21/2023  . Cervical Cancer Screening (Pap smear)  Never done  . DTaP/Tdap/Td (2 - Td or Tdap) 04/27/2032  . Hepatitis B Vaccines 19-59 Average Risk  Completed  . Hepatitis C Screening  Completed  . HIV Screening  Completed  . COVID-19 Vaccine  Discontinued    Discussed health benefits of physical activity, and encouraged her to engage in regular exercise appropriate for her age and condition.  Problem  List Items Addressed This Visit       Cardiovascular  and Mediastinum   Migraine without aura and without status migrainosus, not intractable    Chronic migraines not well controlled. Imitrex  provides some relief. Discussed amitriptyline  as preventive and Imitrex  as abortive treatment. - Prescribe amitriptyline  25 mg at bedtime.  She has discontinued Topamax  - Refer to neurology for further evaluation. Importance of hydration and adequate rest discussed Continue Imitrex  50 mg as needed and ibuprofen  as needed        Relevant Medications   amitriptyline  (ELAVIL ) 25 MG tablet   SUMAtriptan  (IMITREX ) 50 MG tablet   Other Relevant Orders   Ambulatory referral to Neurology     Other   Anxiety and depression   Flowsheet Row Office Visit from 10/23/2023 in [facility] - A Dept Of [facility]  PHQ-9 Total Score 5       10/23/2023    1:47 PM 04/15/2023    2:14 PM 03/13/2023   11:35 AM 01/30/2023   11:59 AM  GAD 7 : Generalized Anxiety Score  Nervous, Anxious, on Edge 1 0 1 1  Control/stop worrying 1 0 0 1  Worry too much - different things 2 0 0 1  Trouble relaxing 2 0 0 1  Restless 1 0 0 0  Easily annoyed or irritable 3 0 0 1  Afraid - awful might happen 1 0 0 0  Total GAD 7 Score 11 0 1 5  Anxiety Difficulty Somewhat difficult Not difficult at all Not difficult at all Somewhat difficult   Anxiety and depression likely related to past trauma. Prefers counseling before medication. - Refer to social worker for counseling - Consider starting new medication for anxiety and depression after counseling. Starting amitriptyline  for migraine        Relevant Medications   amitriptyline  (ELAVIL ) 25 MG tablet   Other Relevant Orders   AMB Referral [facility] Care Management   Obesity (BMI 30-39.9)   Wt Readings from Last 3 Encounters:  10/23/23 221 lb (100.2 kg)  04/15/23 226 lb 12.8 oz (102.9 kg)  03/13/23 217 lb (98.4 kg)   Body mass index is 37.93 kg/m.   Discussed dietary modifications focusing on  vegetables and protein and less carbohydrates  - Encourage regular moderate to vigorous exercises at least  150 minutes weekly as tolerated and dietary modifications.       Annual physical exam - Primary   Annual exam as documented.  Counseling done include healthy lifestyle involving committing to 150 minutes of exercise per week, heart healthy diet, and attaining healthy weight. The importance of adequate sleep also discussed.  Changes in health habits are decided on by patient with goals and time frames set for achieving them. Immunization and cancer screening  needs are specifically addressed at this visit.     - Performed Pap smear. - Performed STD screening . - Perform lipid panel, BMP, CBC. - Discuss benefits of flu vaccine.       Screening for cervical cancer   Pap smear completed without difficulty      Relevant Orders   Cytology - PAP   Screen for STD (sexually transmitted disease)   Relevant Orders   HepB+HepC+HIV Panel   RPR   Chlamydia/Gonococcus/Trichomonas, NAA   Tobacco use   Smoking cessation encouraged      Other Visit Diagnoses       Screening for endocrine, nutritional, metabolic and immunity disorder  Relevant Orders   CBC   CMP14+EGFR   Lipid panel      Return in about 4 weeks (around 11/20/2023), or migraine.     Vinson Tietze R Attallah Ontko, FNP

## 2023-10-23 NOTE — Assessment & Plan Note (Signed)
  Chronic migraines not well controlled. Imitrex  provides some relief. Discussed amitriptyline  as preventive and Imitrex  as abortive treatment. - Prescribe amitriptyline  25 mg at bedtime.  She has discontinued Topamax  - Refer to neurology for further evaluation. Importance of hydration and adequate rest discussed Continue Imitrex  50 mg as needed and ibuprofen  as needed

## 2023-10-23 NOTE — Assessment & Plan Note (Signed)
 Pap smear completed without difficulty

## 2023-10-23 NOTE — Patient Instructions (Signed)
.   Migraine without aura and without status migrainosus, not intractable  - amitriptyline  (ELAVIL ) 25 MG tablet; Take 1 tablet (25 mg total) by mouth at bedtime.  Dispense: 60 tablet; Refill: 1  . Anxiety and depression  - AMB Referral [facility] Care Management    It is important that you exercise regularly at least 30 minutes 5 times a week as tolerated  Think about what you will eat, plan ahead. Choose  clean, green, fresh or frozen over canned, processed or packaged foods which are more sugary, salty and fatty. 70 to 75% of food eaten should be vegetables and fruit. Three meals at set times with snacks allowed between meals, but they must be fruit or vegetables. Aim to eat over a 12 hour period , example 7 am to 7 pm, and STOP after  your last meal of the day. Drink water ,generally about 64 ounces per day, no other drink is as healthy. Fruit juice is best enjoyed in a healthy way, by EATING the fruit.  Thanks for choosing [facility] we consider it a privelige to serve you.

## 2023-10-24 LAB — LIPID PANEL
Chol/HDL Ratio: 2.6 ratio (ref 0.0–4.4)
Cholesterol, Total: 156 mg/dL (ref 100–199)
HDL: 60 mg/dL (ref >39)
LDL Chol Calc (NIH): 85 mg/dL (ref 0–99)
Triglycerides: 53 mg/dL (ref 0–149)
VLDL Cholesterol Cal: 11 mg/dL (ref 5–40)

## 2023-10-24 LAB — HEPB+HEPC+HIV PANEL
HIV Screen 4th Generation wRfx: NONREACTIVE
Hep B C IgM: NEGATIVE
Hep B Core Total Ab: NEGATIVE
Hep B E Ab: NONREACTIVE
Hep B E Ag: NEGATIVE
Hep B Surface Ab, Qual: NONREACTIVE
Hep C Virus Ab: NONREACTIVE
Hepatitis B Surface Ag: NEGATIVE

## 2023-10-24 LAB — CMP14+EGFR
ALT: 14 IU/L (ref 0–32)
AST: 15 IU/L (ref 0–40)
Albumin: 4 g/dL (ref 4.0–5.0)
Alkaline Phosphatase: 52 IU/L (ref 41–116)
BUN/Creatinine Ratio: 10 (ref 9–23)
BUN: 7 mg/dL (ref 6–20)
Bilirubin Total: 0.4 mg/dL (ref 0.0–1.2)
CO2: 26 mmol/L (ref 20–29)
Calcium: 9.5 mg/dL (ref 8.7–10.2)
Chloride: 102 mmol/L (ref 96–106)
Creatinine, Ser: 0.73 mg/dL (ref 0.57–1.00)
Globulin, Total: 2.8 g/dL (ref 1.5–4.5)
Glucose: 87 mg/dL (ref 70–99)
Potassium: 4 mmol/L (ref 3.5–5.2)
Sodium: 141 mmol/L (ref 134–144)
Total Protein: 6.8 g/dL (ref 6.0–8.5)
eGFR: 120 mL/min/1.73 (ref >59)

## 2023-10-24 LAB — RPR: RPR Ser Ql: NONREACTIVE

## 2023-10-24 LAB — CBC
Hematocrit: 35.1 % (ref 34.0–46.6)
Hemoglobin: 10.9 g/dL — ABNORMAL LOW (ref 11.1–15.9)
MCH: 26.7 pg (ref 26.6–33.0)
MCHC: 31.1 g/dL — ABNORMAL LOW (ref 31.5–35.7)
MCV: 86 fL (ref 79–97)
Platelets: 388 x10E3/uL (ref 150–450)
RBC: 4.08 x10E6/uL (ref 3.77–5.28)
RDW: 15.5 % — ABNORMAL HIGH (ref 11.7–15.4)
WBC: 6.3 x10E3/uL (ref 3.4–10.8)

## 2023-10-26 ENCOUNTER — Ambulatory Visit: Payer: Self-pay | Admitting: Nurse Practitioner

## 2023-10-26 DIAGNOSIS — D649 Anemia, unspecified: Secondary | ICD-10-CM

## 2023-10-26 DIAGNOSIS — A5901 Trichomonal vulvovaginitis: Secondary | ICD-10-CM

## 2023-10-27 LAB — IRON,TIBC AND FERRITIN PANEL
Ferritin: 15 ng/mL (ref 15–150)
Iron Saturation: 38 % (ref 15–55)
Iron: 135 ug/dL (ref 27–159)
Total Iron Binding Capacity: 354 ug/dL (ref 250–450)
UIBC: 219 ug/dL (ref 131–425)

## 2023-10-27 LAB — CHLAMYDIA/GONOCOCCUS/TRICHOMONAS, NAA
Chlamydia by NAA: NEGATIVE
Gonococcus by NAA: NEGATIVE
Trich vag by NAA: POSITIVE — AB

## 2023-10-27 LAB — SPECIMEN STATUS REPORT

## 2023-10-27 MED ORDER — METRONIDAZOLE 500 MG PO TABS: 500.0000 mg | ORAL_TABLET | Freq: Two times a day (BID) | ORAL | 0 refills | Status: AC

## 2023-10-28 LAB — CYTOLOGY - PAP: Diagnosis: NEGATIVE

## 2023-10-29 ENCOUNTER — Telehealth: Payer: Self-pay | Admitting: *Deleted

## 2023-10-29 NOTE — Progress Notes (Signed)
 Complex Care Management Note  Care Guide Note 10/29/2023 Name: Hadiya Spoerl MRN: [id number] DOB: 11/02/2000  Kee Maia Bloch is a 23 y.o. year old female who sees Paseda, Folashade R, FNP for primary care. I reached out to [facility] by phone today to offer complex care management services.  Ms. Galer was given information about Complex Care Management services today including:   The Complex Care Management services include support from the care team which includes your Nurse Care Manager, Clinical Social Worker, or Pharmacist.  The Complex Care Management team is here to help remove barriers to the health concerns and goals most important to you. Complex Care Management services are voluntary, and the patient may decline or stop services at any time by request to their care team member.   Complex Care Management Consent Status: Patient agreed to services and verbal consent obtained.   Follow up plan:  Telephone appointment with complex care management team member scheduled for:  11/05/23  Encounter Outcome:  Patient Scheduled  Harlene Satterfield  [facility]  [facility] Guide  Direct Dial: [phone]  Fax [phone]

## 2023-11-05 ENCOUNTER — Telehealth: Payer: Self-pay | Admitting: Licensed Clinical Social Worker

## 2023-11-05 ENCOUNTER — Encounter: Payer: Self-pay | Admitting: Licensed Clinical Social Worker

## 2023-11-05 NOTE — Patient Instructions (Signed)
 Alyssa Mcdaniel - I am sorry I was unable to reach you today for our scheduled appointment. I work with Paseda, Folashade R, FNP and am calling to support your healthcare needs. Please contact me at [phone] at your earliest convenience. I look forward to speaking with you soon.   Thank you,   Cena Ligas, LCSW Clinical Social Worker [facility] Population Health

## 2023-11-09 ENCOUNTER — Telehealth: Payer: Self-pay

## 2023-11-09 NOTE — Progress Notes (Signed)
 Complex Care Management Care Guide Note  11/09/2023 Name: Alyssa Mcdaniel MRN: [id number] DOB: Jul 22, 2000  Alyssa Mcdaniel is a 23 y.o. year old female who is a primary care patient of Paseda, Folashade R, FNP and is actively engaged with the care management team. I reached out to [facility] by phone today to assist with re-scheduling  with the Licensed Clinical Child psychotherapist.  Follow up plan: Telephone appointment with complex care management team member scheduled for:  11/13/23 @ 3 PM  Leotis Rase [facility], [facility] Guide  Direct Dial: [phone]  Fax [phone]

## 2023-11-13 ENCOUNTER — Other Ambulatory Visit: Payer: Self-pay | Admitting: Licensed Clinical Social Worker

## 2023-11-13 ENCOUNTER — Encounter: Payer: Self-pay | Admitting: Licensed Clinical Social Worker

## 2023-11-13 NOTE — Patient Outreach (Signed)
 Complex Care Management   Visit Note  11/13/2023  Name:  Sherle Mello MRN: [id number] DOB: 2000/05/09  Situation: Referral received for Complex Care Management related to Mental/Behavioral Health and SDOH Barriers:  Transportation I obtained verbal consent from Patient.  Visit completed with Patient  on the phone. Patient lives at home with her children. Patient report Her and her children have missed appointments due to not having transportation. Patient also reports periods of sadness and believes she would benefit from seeing a therapist.  Background:   Past Medical History:  Diagnosis Date   Anxiety    Asthma    Depression    Headache    PCOS (polycystic ovarian syndrome)    Sleeping difficulty 02/24/2018   Superficial thrombophlebitis in puerperium 03/15/2021   Tension headache 02/24/2018    Assessment: Patient Reported Symptoms:  Cognitive Cognitive Status: Alert and oriented to person, place, and time, Normal speech and language skills Cognitive/Intellectual Conditions Management [RPT]: None reported or documented in medical history or problem list      Neurological Neurological Review of Symptoms: No symptoms reported    HEENT HEENT Symptoms Reported: No symptoms reported      Cardiovascular Cardiovascular Symptoms Reported: No symptoms reported    Respiratory Respiratory Symptoms Reported: No symptoms reported    Endocrine Endocrine Symptoms Reported: No symptoms reported Is patient diabetic?: No    Gastrointestinal Gastrointestinal Symptoms Reported: No symptoms reported      Genitourinary Genitourinary Symptoms Reported: No symptoms reported    Integumentary Integumentary Symptoms Reported: No symptoms reported    Musculoskeletal Musculoskelatal Symptoms Reviewed: No symptoms reported        Psychosocial Psychosocial Symptoms Reported: No symptoms reported, Sadness - if selected complete PHQ 2-9     Quality of Family Relationships: involved,  supportive Do you feel physically threatened by others?: No    11/13/2023    PHQ2-9 Depression Screening   Little interest or pleasure in doing things Not at all  Feeling down, depressed, or hopeless Not at all  PHQ-2 - Total Score 0  Trouble falling or staying asleep, or sleeping too much    Feeling tired or having little energy    Poor appetite or overeating     Feeling bad about yourself - or that you are a failure or have let yourself or your family down    Trouble concentrating on things, such as reading the newspaper or watching television    Moving or speaking so slowly that other people could have noticed.  Or the opposite - being so fidgety or restless that you have been moving around a lot more than usual    Thoughts that you would be better off dead, or hurting yourself in some way    PHQ2-9 Total Score    If you checked off any problems, how difficult have these problems made it for you to do your work, take care of things at home, or get along with other people    Depression Interventions/Treatment      There were no vitals filed for this visit.  Medications Reviewed Today     Reviewed by Veva Bolt, LCSW (Social Worker) on 11/13/23 at 1535  Med List Status: <None>   Medication Order Taking? Sig Documenting Provider Last Dose Status Informant  amitriptyline  (ELAVIL ) 25 MG tablet [id number] Yes Take 1 tablet (25 mg total) by mouth at bedtime. Paseda, Folashade R, FNP  Active   fluticasone  (FLOVENT  HFA) 110 MCG/ACT inhaler [id number]  Inhale 2 puffs  into the lungs in the morning and at bedtime.  Patient not taking: Reported on 10/23/2023   Fredirick Glenys RAMAN, MD  Active Self, Pharmacy Records  SUMAtriptan  (IMITREX ) 50 MG tablet [id number] Yes Take 1 tablet (50 mg total) by mouth every 2 (two) hours as needed for migraine. May repeat in 2 hours if headache persists or recurs. Paseda, Folashade R, FNP  Active             Recommendation:   PCP Follow-up Continue Current  Plan of Care  Follow Up Plan:   Telephone follow up appointment date/time:  11/27/23 at 3pm  Cena Ligas, LCSW Clinical Social Worker [facility] Population Health

## 2023-11-13 NOTE — Patient Instructions (Signed)
 Kee Maia Bloch - I am sorry I was unable to reach you today for our scheduled appointment. I work with Paseda, Folashade R, FNP and am calling to support your healthcare needs. Please contact me at [phone] at your earliest convenience. I look forward to speaking with you soon.   Thank you,   Cena Ligas, LCSW Clinical Social Worker [facility]

## 2023-11-13 NOTE — Patient Instructions (Signed)
 Visit Information  Thank you for taking time to visit with me today. Please don't hesitate to contact me if I can be of assistance to you before our next scheduled appointment.  Our next appointment is by telephone on 11/27/23 at 3pm. Please call the care guide team at [phone] if you need to cancel or reschedule your appointment.   Following is a copy of your care plan:   Goals Addressed             This Visit's Progress    [facility] Care Plan LCSW       Problems:   Mental Health Concerns  and transportation issues  CSW Clinical Goal(s):   Over the next 90 days, the patient will report a decreased feeling of sadness, as evidenced by beginning and attending at least one therapy session per week, as documented by self-report  Interventions:  Mental Health:  Evaluation of current treatment plan related to feelings of sadness and depression symptoms Active listening / Reflection utilized Discussed referral options to connect for ongoing therapy:   Emotional Support Provided PHQ2/PHQ9 completed Solution-Focued Strategies employed: Discussed triggers and coping skills Discussed self care activities Discussed transportation options Discussed medicaid transportation through DSS  Patient Goals/Self-Care Activities:  Call Department of Social Services [phone] to inquire about transportation services. Connect with provider for ongoing mental health treatment.   Patient will increase coping skills and healthy habits.  Plan:   Telephone follow up appointment with care management team member scheduled for:  11/27/23 at 3pm.        Please call the Suicide and Crisis Lifeline: 988 call the [place]  National Suicide Prevention Lifeline: (409)410-6245 or TTY: 909-135-0924 TTY 984-440-9958) to talk to a trained counselor call 1-800-273-TALK (toll free, 24 hour hotline) call 911 if you are experiencing a Mental Health or Behavioral Health Crisis or need someone to  talk to.  Patient verbalized understanding of Care plan and visit instructions communicated this visit  Cena Ligas, LCSW Clinical Social Worker [facility] [employer]

## 2023-11-20 ENCOUNTER — Ambulatory Visit: Payer: Self-pay | Admitting: Nurse Practitioner

## 2023-11-27 ENCOUNTER — Other Ambulatory Visit: Payer: Self-pay | Admitting: Licensed Clinical Social Worker

## 2023-11-27 NOTE — Patient Instructions (Signed)
 Visit Information  Thank you for taking time to visit with me today. Please don't hesitate to contact me if I can be of assistance to you before our next scheduled appointment.  Our next appointment is by telephone on 12/11/23 at 3pm. Please call the care guide team at [phone] if you need to cancel or reschedule your appointment.   Following is a copy of your care plan:   Goals Addressed             This Visit's Progress    [facility] Care Plan LCSW       Problems:   Mental Health Concerns  and transportation issues  CSW Clinical Goal(s):   Over the next 90 days, the patient will report a decreased feeling of sadness, as evidenced by beginning and attending at least one therapy session per week, as documented by self-report  Interventions:  Mental Health:  Evaluation of current treatment plan related to feelings of sadness and depression symptoms Active listening / Reflection utilized Discussed referral options to connect for ongoing therapy:   Emotional Support Provided PHQ2/PHQ9 completed Solution-Focued Strategies employed: Discussed triggers and coping skills Discussed self care activities Discussed transportation options Discussed medicaid transportation through DSS 11/27/23 Discussed transportation through DSS- not contact Discussed barriers to calling Discussed counseling referral to [facility]  Patient Goals/Self-Care Activities:  Call Department of Social Services [phone] to inquire about transportation services. Connect with provider for ongoing mental health treatment.   Patient will increase coping skills and healthy habits.  Plan:   Telephone follow up appointment with care management team member scheduled for:  12/11/23 at 3pm.        Please call 911 if you are experiencing a Mental Health or Behavioral Health Crisis or need someone to talk to.  Patient verbalized understanding of Care plan and visit instructions communicated this  visit  Cena Ligas, LCSW Clinical Social Worker [facility] [employer]

## 2023-11-27 NOTE — Patient Outreach (Signed)
 Complex Care Management   Visit Note  11/27/2023  Name:  Alyssa Mcdaniel MRN: [id number] DOB: 2000-05-29  Situation: Referral received for Complex Care Management related to SDOH Barriers:  Transportation Depression I obtained verbal consent from Patient.  Visit completed with Patient  on the phone.  Background:   Past Medical History:  Diagnosis Date   Anxiety    Asthma    Depression    Headache    PCOS (polycystic ovarian syndrome)    Sleeping difficulty 02/24/2018   Superficial thrombophlebitis in puerperium 03/15/2021   Tension headache 02/24/2018    Assessment: LCSW spoke to patient on the phone. Patient stated she is doing well and has been feeling less sad than before. LCSW inquired about patient calling DSS for transportation. Patient stated she did not call because she has been busy. Patient states she still has the information and will call when she needs to. LCSW inquired about therapy. Patient is interested in seeing a therapist and LCSW discussed a referral to [facility] counseling. LCSW will complete referral.  Patient Reported Symptoms:  Cognitive Cognitive Status: Normal speech and language skills, Alert and oriented to person, place, and time Cognitive/Intellectual Conditions Management [RPT]: None reported or documented in medical history or problem list      Neurological Neurological Review of Symptoms: Not assessed    HEENT HEENT Symptoms Reported: Not assessed      Cardiovascular Cardiovascular Symptoms Reported: Not assessed    Respiratory Respiratory Symptoms Reported: Not assesed    Endocrine Endocrine Symptoms Reported: Not assessed    Gastrointestinal Gastrointestinal Symptoms Reported: Not assessed      Genitourinary Genitourinary Symptoms Reported: Not assessed    Integumentary Integumentary Symptoms Reported: Not assessed    Musculoskeletal Musculoskelatal Symptoms Reviewed: Not assessed        Psychosocial Psychosocial Symptoms  Reported: No symptoms reported Additional Psychological Details: Pt reports improvment in mood and less sadness Behavioral Management Strategies: Coping strategies, Medication therapy, Activity, Support system        11/27/2023    PHQ2-9 Depression Screening   Little interest or pleasure in doing things    Feeling down, depressed, or hopeless    PHQ-2 - Total Score    Trouble falling or staying asleep, or sleeping too much    Feeling tired or having little energy    Poor appetite or overeating     Feeling bad about yourself - or that you are a failure or have let yourself or your family down    Trouble concentrating on things, such as reading the newspaper or watching television    Moving or speaking so slowly that other people could have noticed.  Or the opposite - being so fidgety or restless that you have been moving around a lot more than usual    Thoughts that you would be better off dead, or hurting yourself in some way    PHQ2-9 Total Score    If you checked off any problems, how difficult have these problems made it for you to do your work, take care of things at home, or get along with other people    Depression Interventions/Treatment      There were no vitals filed for this visit.  Medications Reviewed Today   Medications were not reviewed in this encounter     Recommendation:   PCP Follow-up Continue Current Plan of Care  Follow Up Plan:   Telephone follow up appointment date/time:  12/11/23 at 3pm.  Cena Ligas, LCSW Clinical  Social Worker [employer]

## 2023-12-11 ENCOUNTER — Telehealth: Payer: Self-pay | Admitting: Licensed Clinical Social Worker

## 2023-12-28 ENCOUNTER — Ambulatory Visit: Admitting: Nurse Practitioner

## 2024-01-01 ENCOUNTER — Encounter: Payer: Self-pay | Admitting: Licensed Clinical Social Worker

## 2024-01-01 ENCOUNTER — Telehealth: Payer: Self-pay | Admitting: Licensed Clinical Social Worker

## 2024-01-01 NOTE — Patient Instructions (Signed)
 Kee Maia Bloch - I am sorry I was unable to reach you today for our scheduled appointment. I work with Paseda, Folashade R, FNP and am calling to support your healthcare needs. Please contact me at [phone] at your earliest convenience. I look forward to speaking with you soon.   Thank you,   Cena Ligas, LCSW Clinical Social Worker [facility]

## 2024-01-08 ENCOUNTER — Ambulatory Visit: Admitting: Nurse Practitioner

## 2024-01-11 ENCOUNTER — Other Ambulatory Visit: Payer: Self-pay | Admitting: Licensed Clinical Social Worker

## 2024-01-11 NOTE — Patient Instructions (Signed)
 Visit Information  Thank you for taking time to visit with me today. Please don't hesitate to contact me if I can be of assistance to you before our next scheduled appointment.  Our next appointment is by telephone on 01/25/24 at 3pm. Please call the care guide team at [phone] if you need to cancel or reschedule your appointment.   Following is a copy of your care plan:   Goals Addressed             This Visit's Progress    [facility] Care Plan LCSW       Problems:   Mental Health Concerns  and transportation issues  CSW Clinical Goal(s):   Over the next 90 days, the patient will report a decreased feeling of sadness, as evidenced by beginning and attending at least one therapy session per week, as documented by self-report  Interventions:  Mental Health:  Evaluation of current treatment plan related to feelings of sadness and depression symptoms Active listening / Reflection utilized Discussed referral options to connect for ongoing therapy:   Emotional Support Provided PHQ2/PHQ9 completed Solution-Focued Strategies employed: Discussed triggers and coping skills Discussed self care activities Discussed transportation options Discussed medicaid transportation through DSS 11/27/23 Discussed transportation through DSS- not contact Discussed barriers to calling Discussed counseling referral to [facility] 01/11/24 Pt has made contact with DSS for transportation Pt has not made contact with therapist  Patient Goals/Self-Care Activities:  Call Department of Social Services [phone] to inquire about transportation services. Connect with provider for ongoing mental health treatment.   Patient will increase coping skills and healthy habits.  Plan:   Telephone follow up appointment with care management team member scheduled for:  01/25/24 at 3pm.        Please call 911 if you are experiencing a Mental Health or Behavioral Health Crisis or need someone to  talk to.  Patient verbalized understanding of Care plan and visit instructions communicated this visit  Cena Ligas, LCSW Clinical Social Worker [facility] [employer]

## 2024-01-11 NOTE — Patient Outreach (Signed)
 Complex Care Management   Visit Note  01/11/2024  Name:  Alyssa Mcdaniel MRN: [id number] DOB: 10/27/2000  Situation: Referral received for Complex Care Management related to Mental/Behavioral Health diagnosis sadness and SDOH Barriers:  Transportation I obtained verbal consent from Patient.  Visit completed with Patient  on the phone.  Background:   Past Medical History:  Diagnosis Date   Anxiety    Asthma    Depression    Headache    PCOS (polycystic ovarian syndrome)    Sleeping difficulty 02/24/2018   Superficial thrombophlebitis in puerperium 03/15/2021   Tension headache 02/24/2018    Assessment: LCSW spoke with patient on the phone. Patient reports she is doing ok. Patient reports she was able to get transportation set up with DSS. LCSW inquired about patient being connected to a therapist. Patient stated she was at work and asked LCSW to call back.   Patient Reported Symptoms:  Cognitive Cognitive Status: Alert and oriented to person, place, and time, Normal speech and language skills Cognitive/Intellectual Conditions Management [RPT]: None reported or documented in medical history or problem list      Neurological Neurological Review of Symptoms: Not assessed    HEENT HEENT Symptoms Reported: Not assessed      Cardiovascular Cardiovascular Symptoms Reported: Not assessed    Respiratory Respiratory Symptoms Reported: Not assesed    Endocrine Endocrine Symptoms Reported: Not assessed    Gastrointestinal Gastrointestinal Symptoms Reported: Not assessed      Genitourinary Genitourinary Symptoms Reported: Not assessed    Integumentary Integumentary Symptoms Reported: Not assessed    Musculoskeletal Musculoskelatal Symptoms Reviewed: Not assessed        Psychosocial            01/11/2024    PHQ2-9 Depression Screening   Little interest or pleasure in doing things    Feeling down, depressed, or hopeless    PHQ-2 - Total Score    Trouble falling  or staying asleep, or sleeping too much    Feeling tired or having little energy    Poor appetite or overeating     Feeling bad about yourself - or that you are a failure or have let yourself or your family down    Trouble concentrating on things, such as reading the newspaper or watching television    Moving or speaking so slowly that other people could have noticed.  Or the opposite - being so fidgety or restless that you have been moving around a lot more than usual    Thoughts that you would be better off dead, or hurting yourself in some way    PHQ2-9 Total Score    If you checked off any problems, how difficult have these problems made it for you to do your work, take care of things at home, or get along with other people    Depression Interventions/Treatment      There were no vitals filed for this visit.    Medications Reviewed Today     Reviewed by Veva Bolt, LCSW (Social Worker) on 01/11/24 at 1655  Med List Status: <None>   Medication Order Taking? Sig Documenting Provider Last Dose Status Informant  amitriptyline  (ELAVIL ) 25 MG tablet [id number]  Take 1 tablet (25 mg total) by mouth at bedtime. Paseda, Folashade R, FNP  Active   fluticasone  (FLOVENT  HFA) 110 MCG/ACT inhaler [id number]  Inhale 2 puffs into the lungs in the morning and at bedtime.  Patient not taking: Reported on 10/23/2023   Fredirick Glenys RAMAN,  MD  Active Self, Pharmacy Records  SUMAtriptan  (IMITREX ) 50 MG tablet [id number]  Take 1 tablet (50 mg total) by mouth every 2 (two) hours as needed for migraine. May repeat in 2 hours if headache persists or recurs. Paseda, Folashade R, FNP  Active             Recommendation:   PCP Follow-up Continue Current Plan of Care  Follow Up Plan:   Telephone follow up appointment date/time:  01/25/24 at 3pm.  Cena Ligas, LCSW Clinical Social Worker [facility]

## 2024-01-25 ENCOUNTER — Other Ambulatory Visit: Payer: Self-pay | Admitting: Licensed Clinical Social Worker

## 2024-01-25 NOTE — Patient Outreach (Signed)
 Complex Care Management   Visit Note  01/25/2024  Name:  Alyssa Mcdaniel MRN: [id number] DOB: 09-Jan-2001  Situation: Referral received for Complex Care Management related to Mental/Behavioral Health diagnosis depression. I obtained verbal consent from Patient.  Visit completed with Patient  on the phone.  Background:   Past Medical History:  Diagnosis Date   Anxiety    Asthma    Depression    Headache    PCOS (polycystic ovarian syndrome)    Sleeping difficulty 02/24/2018   Superficial thrombophlebitis in puerperium 03/15/2021   Tension headache 02/24/2018    Assessment: LCSW spoke to patient on the phone. Patient states she is doing well and her and her kids had a good holiday season. Patient stated she has not contacted any therapist. LCS resent list to patient and encouraged her to reach out to make an appointment with a provider.  Patient Reported Symptoms:  Cognitive Cognitive Status: Alert and oriented to person, place, and time, Normal speech and language skills Cognitive/Intellectual Conditions Management [RPT]: None reported or documented in medical history or problem list   Health Maintenance Behaviors: None Healing Pattern: Average Health Facilitated by: Prayer/meditation, Stress management  Neurological Neurological Review of Symptoms: No symptoms reported    HEENT HEENT Symptoms Reported: No symptoms reported      Cardiovascular Cardiovascular Symptoms Reported: No symptoms reported    Respiratory Respiratory Symptoms Reported: No symptoms reported, Productive cough Other Respiratory Symptoms: Pt reports getting over the flu Respiratory Management Strategies: Routine screening, Coping strategies, Adequate rest Respiratory Self-Management Outcome: 4 (good)  Endocrine Endocrine Symptoms Reported: No symptoms reported    Gastrointestinal Gastrointestinal Symptoms Reported: No symptoms reported      Genitourinary Genitourinary Symptoms Reported: No  symptoms reported    Integumentary Integumentary Symptoms Reported: No symptoms reported    Musculoskeletal Musculoskelatal Symptoms Reviewed: No symptoms reported        Psychosocial Psychosocial Symptoms Reported: No symptoms reported Additional Psychological Details: Pt reports she is feeling good Behavioral Management Strategies: Activity, Adequate rest, Coping strategies Behavioral Health Self-Management Outcome: 4 (good) Major Change/Loss/Stressor/Fears (CP): Denies Techniques to Cope with Loss/Stress/Change: Diversional activities, Play      01/25/2024    PHQ2-9 Depression Screening   Little interest or pleasure in doing things Not at all  Feeling down, depressed, or hopeless Not at all  PHQ-2 - Total Score 0  Trouble falling or staying asleep, or sleeping too much    Feeling tired or having little energy    Poor appetite or overeating     Feeling bad about yourself - or that you are a failure or have let yourself or your family down    Trouble concentrating on things, such as reading the newspaper or watching television    Moving or speaking so slowly that other people could have noticed.  Or the opposite - being so fidgety or restless that you have been moving around a lot more than usual    Thoughts that you would be better off dead, or hurting yourself in some way    PHQ2-9 Total Score    If you checked off any problems, how difficult have these problems made it for you to do your work, take care of things at home, or get along with other people    Depression Interventions/Treatment      There were no vitals filed for this visit.    Medications Reviewed Today     Reviewed by Veva Bolt, LCSW (Social Worker) on 01/25/24 at  1500  Med List Status: <None>   Medication Order Taking? Sig Documenting Provider Last Dose Status Informant  amitriptyline  (ELAVIL ) 25 MG tablet [id number]  Take 1 tablet (25 mg total) by mouth at bedtime. Paseda, Folashade R, FNP  Active    fluticasone  (FLOVENT  HFA) 110 MCG/ACT inhaler [id number]  Inhale 2 puffs into the lungs in the morning and at bedtime.  Patient not taking: Reported on 10/23/2023   Fredirick Glenys RAMAN, MD  Active Self, Pharmacy Records  SUMAtriptan  (IMITREX ) 50 MG tablet [id number]  Take 1 tablet (50 mg total) by mouth every 2 (two) hours as needed for migraine. May repeat in 2 hours if headache persists or recurs. Paseda, Folashade R, FNP  Active             Recommendation:   PCP Follow-up Continue Current Plan of Care  Follow Up Plan:   Telephone follow up appointment date/time:  02/08/24 at 3pm.  Cena Ligas, LCSW Clinical Social Worker [facility]

## 2024-01-25 NOTE — Patient Instructions (Signed)
 Visit Information  Thank you for taking time to visit with me today. Please don't hesitate to contact me if I can be of assistance to you before our next scheduled appointment.  Our next appointment is by telephone on 02/08/24 at 3pm. Please call the care guide team at [phone] if you need to cancel or reschedule your appointment.   Following is a copy of your care plan:   Goals Addressed             This Visit's Progress    [facility] Social Work Care Plan LCSW       Problems:   Mental Health Concerns  and transportation issues  CSW Clinical Goal(s):   Over the next 90 days, the patient will report a decreased feeling of sadness, as evidenced by beginning and attending at least one therapy session per week, as documented by self-report  Interventions:  Mental Health:  Evaluation of current treatment plan related to feelings of sadness and depression symptoms Active listening / Reflection utilized Discussed referral options to connect for ongoing therapy:   Emotional Support Provided PHQ2/PHQ9 completed Solution-Focued Strategies employed: Discussed triggers and coping skills Discussed self care activities Discussed transportation options Discussed medicaid transportation through DSS 11/27/23 Discussed transportation through DSS- not contact Discussed barriers to calling Discussed counseling referral to [facility] 01/11/24 Pt has made contact with DSS for transportation Pt has not made contact with therapist 01/25/24 Discussed barriers to contacting therapist Resent list to email  Patient Goals/Self-Care Activities:  Call Department of Social Services [phone] to inquire about transportation services. Connect with provider for ongoing mental health treatment.   Patient will increase coping skills and healthy habits.  Plan:   Telephone follow up appointment with care management team member scheduled for:  02/08/24 at 3pm.        Please call 911 if you are  experiencing a Mental Health or Behavioral Health Crisis or need someone to talk to.  Patient verbalized understanding of Care plan and visit instructions communicated this visit  Cena Ligas, LCSW Clinical Social Worker [facility] [employer]

## 2024-02-02 ENCOUNTER — Ambulatory Visit (INDEPENDENT_AMBULATORY_CARE_PROVIDER_SITE_OTHER): Admitting: Nurse Practitioner

## 2024-02-02 VITALS — BP 111/74 | HR 78 | Wt 214.0 lb

## 2024-02-02 DIAGNOSIS — Z720 Tobacco use: Secondary | ICD-10-CM | POA: Diagnosis not present

## 2024-02-02 DIAGNOSIS — F321 Major depressive disorder, single episode, moderate: Secondary | ICD-10-CM | POA: Diagnosis not present

## 2024-02-02 DIAGNOSIS — F331 Major depressive disorder, recurrent, moderate: Secondary | ICD-10-CM | POA: Insufficient documentation

## 2024-02-02 DIAGNOSIS — Z5941 Food insecurity: Secondary | ICD-10-CM | POA: Insufficient documentation

## 2024-02-02 DIAGNOSIS — G43009 Migraine without aura, not intractable, without status migrainosus: Secondary | ICD-10-CM

## 2024-02-02 DIAGNOSIS — Z113 Encounter for screening for infections with a predominantly sexual mode of transmission: Secondary | ICD-10-CM | POA: Diagnosis not present

## 2024-02-02 MED ORDER — AMITRIPTYLINE HCL 50 MG PO TABS: 50.0000 mg | ORAL_TABLET | Freq: Every day | ORAL | 1 refills | Status: AC

## 2024-02-02 NOTE — Progress Notes (Signed)
 "  Acute Office Visit  Subjective:     Patient ID: Alyssa Mcdaniel, female    DOB: 13-Nov-2000, 24 y.o.   MRN: [id number]  Chief Complaint  Patient presents with   Medical Management of Chronic Issues    Sti screening    HPI  Discussed the use of AI scribe software for clinical note transcription with the patient, who gave verbal consent to proceed.  History of Present Illness Alyssa Mcdaniel is a 24 year old female  has a past medical history of Anxiety, Asthma, Depression, Headache, PCOS (polycystic ovarian syndrome), Sleeping difficulty (02/24/2018), Superficial thrombophlebitis in puerperium (03/15/2021), and Tension headache (02/24/2018).  who presents for repeat STD testing and evaluation of depression.   She has a history of a previous positive test for a sexually transmitted disease and wishes to repeat the test to ensure her overall health is fine. She recently had a yeast infection, for which she used an over-the-counter treatment that provided relief. She maintains one partner and uses condoms to reduce the risk of STDs.  She has been experiencing depression over the past few months, feeling overwhelmed by life events, including a recent breakup and the challenges of being 23 and having children. She has no thoughts of self-harm. She has not yet seen a therapist but was previously in contact with a child psychotherapist who provided support and resources.  She is currently taking amitriptyline  25 mg at bedtime and Imitrex  as needed for headaches. The medication helps with her headaches when she adheres to her regimen, with no side effects reported.  She smokes one to two cigarettes a day and is working on quitting. She reports concerns about food security, relying on food stamps which are delayed this month. 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  Her children recently had a cold that started with a stomach bug and progressed to a sinus cold,  but they are recovering. She does not receive the flu shot.    Assessment & Plan       Review of Systems  Constitutional:  Negative for appetite change, chills, fatigue and fever.  HENT:  Negative for congestion, postnasal drip, rhinorrhea and sneezing.   Respiratory:  Negative for cough, shortness of breath and wheezing.   Cardiovascular:  Negative for chest pain, palpitations and leg swelling.  Gastrointestinal:  Negative for abdominal pain, constipation, nausea and vomiting.  Genitourinary:  Negative for difficulty urinating, dysuria, flank pain and frequency.  Musculoskeletal:  Negative for arthralgias, back pain, joint swelling and myalgias.  Skin:  Negative for color change, pallor, rash and wound.  Neurological:  Negative for dizziness, facial asymmetry, weakness, numbness and headaches.  Psychiatric/Behavioral:  Negative for behavioral problems, confusion, self-injury and suicidal ideas.         Objective:    BP 111/74   Pulse 78   Wt 214 lb (97.1 kg)   SpO2 100%   BMI 36.73 kg/m    Physical Exam Vitals and nursing note reviewed.  Constitutional:      General: She is not in acute distress.    Appearance: Normal appearance. She is not ill-appearing, toxic-appearing or diaphoretic.  HENT:     Mouth/Throat:     Mouth: Mucous membranes are moist.     Pharynx: Oropharynx is clear. No oropharyngeal exudate or posterior oropharyngeal erythema.  Eyes:     General: No scleral icterus.       Right eye: No discharge.  Left eye: No discharge.     Extraocular Movements: Extraocular movements intact.     Conjunctiva/sclera: Conjunctivae normal.  Cardiovascular:     Rate and Rhythm: Normal rate and regular rhythm.     Pulses: Normal pulses.     Heart sounds: Normal heart sounds. No murmur heard.    No friction rub. No gallop.  Pulmonary:     Effort: Pulmonary effort is normal. No respiratory distress.     Breath sounds: Normal breath sounds. No stridor. No  wheezing, rhonchi or rales.  Chest:     Chest wall: No tenderness.  Abdominal:     General: There is no distension.     Palpations: Abdomen is soft.     Tenderness: There is no abdominal tenderness. There is no right CVA tenderness, left CVA tenderness or guarding.  Musculoskeletal:        General: No swelling, tenderness, deformity or signs of injury.     Right lower leg: No edema.     Left lower leg: No edema.  Skin:    General: Skin is warm and dry.     Capillary Refill: Capillary refill takes less than 2 seconds.     Coloration: Skin is not jaundiced or pale.     Findings: No bruising, erythema or lesion.  Neurological:     Mental Status: She is alert and oriented to person, place, and time.     Motor: No weakness.     Coordination: Coordination normal.     Gait: Gait normal.  Psychiatric:        Mood and Affect: Mood normal.        Behavior: Behavior normal.        Thought Content: Thought content normal.        Judgment: Judgment normal.     Comments: tearful     No results found for any visits on 02/02/24.      Assessment & Plan:   Problem List Items Addressed This Visit       Cardiovascular and Mediastinum   Migraine without aura and without status migrainosus, not intractable   Migraine without aura Amitriptyline  and Imitrex  effective for headache management. - Continue amitriptyline  increased to 50 mg for uncontrolled depression - Continue Imitrex  as needed for migraine management.       Relevant Medications   amitriptyline  (ELAVIL ) 50 MG tablet     Other   Depression      02/02/2024    1:42 PM 01/25/2024    3:02 PM 11/13/2023    3:37 PM  PHQ9 SCORE ONLY  PHQ-9 Total Score 12 0 0    Increased depressive symptoms due to life events. No suicidal ideation. Amitriptyline  effective for headaches, no side effects reported. - Increased amitriptyline  to 50 mg daily. - Advised to contact one of the therapist recommended by the social worker and schedule an  appointment - Continue follow-up with social worker for ongoing mental health support.       Relevant Medications   amitriptyline  (ELAVIL ) 50 MG tablet   Screen for STD (sexually transmitted disease) - Primary   Screening for sexually transmitted infections Desires repeat testing for reassurance after recent positive trichomoniasis test. - Ordered repeat STD testing. Advised to maintain 1 partner and use condoms        Relevant Orders   NuSwab Vaginitis Plus (VG+)   Tobacco use   Nicotine dependence, cigarettes Smoking 1-2 cigarettes daily. Acknowledges health risks and desires to quit. - Encouraged smoking cessation  efforts.       Food insecurity    Concerns about food running out monthly. Supported by [employer] and employment. Food from the clinic pantry provided        Meds ordered this encounter  Medications   amitriptyline  (ELAVIL ) 50 MG tablet    Sig: Take 1 tablet (50 mg total) by mouth at bedtime.    Dispense:  60 tablet    Refill:  1    Return in about 2 months (around 04/01/2024) for DEPRESSION.  Dominyck Reser R Jarrad Mclees, FNP  "

## 2024-02-02 NOTE — Assessment & Plan Note (Addendum)
" °    02/02/2024    1:42 PM 01/25/2024    3:02 PM 11/13/2023    3:37 PM  PHQ9 SCORE ONLY  PHQ-9 Total Score 12 0 0    Increased depressive symptoms due to life events. No suicidal ideation. Amitriptyline  effective for headaches, no side effects reported. - Increased amitriptyline  to 50 mg daily. - Advised to contact one of the therapist recommended by the social worker and schedule an appointment - Continue follow-up with social worker for ongoing mental health support.  "

## 2024-02-02 NOTE — Assessment & Plan Note (Signed)
 Nicotine dependence, cigarettes Smoking 1-2 cigarettes daily. Acknowledges health risks and desires to quit. - Encouraged smoking cessation efforts.

## 2024-02-02 NOTE — Assessment & Plan Note (Signed)
 Migraine without aura Amitriptyline  and Imitrex  effective for headache management. - Continue amitriptyline  increased to 50 mg for uncontrolled depression - Continue Imitrex  as needed for migraine management.

## 2024-02-02 NOTE — Assessment & Plan Note (Signed)
" °  Concerns about food running out monthly. Supported by [employer] and employment. Food from the clinic pantry provided  "

## 2024-02-02 NOTE — Patient Instructions (Signed)
 1. Screen for STD (sexually transmitted disease) (Primary) - NuSwab Vaginitis Plus (VG+)  2. Food insecurity  3. Major depressive disorder, recurrent episode, moderate degree (HCC) - amitriptyline  (ELAVIL ) 50 MG tablet; Take 1 tablet (50 mg total) by mouth at bedtime.  Dispense: 60 tablet; Refill: 1    It is important that you exercise regularly at least 30 minutes 5 times a week as tolerated  Think about what you will eat, plan ahead. Choose  clean, green, fresh or frozen over canned, processed or packaged foods which are more sugary, salty and fatty. 70 to 75% of food eaten should be vegetables and fruit. Three meals at set times with snacks allowed between meals, but they must be fruit or vegetables. Aim to eat over a 12 hour period , example 7 am to 7 pm, and STOP after  your last meal of the day. Drink water ,generally about 64 ounces per day, no other drink is as healthy. Fruit juice is best enjoyed in a healthy way, by EATING the fruit.  Thanks for choosing [facility] we consider it a privelige to serve you.

## 2024-02-02 NOTE — Assessment & Plan Note (Signed)
 Screening for sexually transmitted infections Desires repeat testing for reassurance after recent positive trichomoniasis test. - Ordered repeat STD testing. Advised to maintain 1 partner and use condoms

## 2024-02-04 LAB — NUSWAB VAGINITIS PLUS (VG+)
Candida albicans, NAA: NEGATIVE
Candida glabrata, NAA: NEGATIVE
Chlamydia trachomatis, NAA: NEGATIVE
Neisseria gonorrhoeae, NAA: NEGATIVE
Trich vag by NAA: NEGATIVE

## 2024-02-05 ENCOUNTER — Ambulatory Visit: Payer: Self-pay | Admitting: Nurse Practitioner

## 2024-02-08 ENCOUNTER — Other Ambulatory Visit: Payer: Self-pay | Admitting: Licensed Clinical Social Worker

## 2024-02-09 NOTE — Patient Instructions (Signed)
 Visit Information  Thank you for taking time to visit with me today. Please don't hesitate to contact me if I can be of assistance to you before our next scheduled appointment.  Our next appointment is by telephone on 02/22/24 at 3pm. Please call the care guide team at [phone] if you need to cancel or reschedule your appointment.   Following is a copy of your care plan:   Goals Addressed             This Visit's Progress    [facility] Social Work Care Plan LCSW       Problems:   Mental Health Concerns  and transportation issues  CSW Clinical Goal(s):   Over the next 90 days, the patient will report a decreased feeling of sadness, as evidenced by beginning and attending at least one therapy session per week, as documented by self-report  Interventions:  Mental Health:  Evaluation of current treatment plan related to feelings of sadness and depression symptoms Active listening / Reflection utilized Discussed referral options to connect for ongoing therapy:   Emotional Support Provided PHQ2/PHQ9 completed Solution-Focued Strategies employed: Discussed triggers and coping skills Discussed self care activities Discussed transportation options Discussed medicaid transportation through DSS 11/27/23 Discussed transportation through DSS- not contact Discussed barriers to calling Discussed counseling referral to [facility] 01/11/24 Pt has made contact with DSS for transportation Pt has not made contact with therapist 01/25/24 Discussed barriers to contacting therapist Resent list to email 02/08/24 Discussed barriers to calling therapist Provided encourgement to set up appointment  Patient Goals/Self-Care Activities:  Call Department of Social Services [phone] to inquire about transportation services. Connect with provider for ongoing mental health treatment.   Patient will increase coping skills and healthy habits.  Plan:   Telephone follow up appointment with care  management team member scheduled for:  02/22/24 at 3pm.        Please call 911 if you are experiencing a Mental Health or Behavioral Health Crisis or need someone to talk to.  Patient verbalized understanding of Care plan and visit instructions communicated this visit  Cena Ligas, LCSW Clinical Social Worker [facility] [employer]

## 2024-02-09 NOTE — Patient Outreach (Signed)
 Complex Care Management   Visit Note  02/09/2024  Name:  Shalane Florendo MRN: [id number] DOB: [birth year]  Situation: Referral received for Complex Care Management related to Mental/Behavioral Health diagnosis. I obtained verbal consent from Patient.  Visit completed with Patient  on the phone  Background:   Past Medical History:  Diagnosis Date   Anxiety    Asthma    Depression    Headache    PCOS (polycystic ovarian syndrome)    Sleeping difficulty 02/24/2018   Superficial thrombophlebitis in puerperium 03/15/2021   Tension headache 02/24/2018    Assessment: Patient Reported Symptoms:  Cognitive Cognitive Status: Alert and oriented to person, place, and time, Normal speech and language skills Cognitive/Intellectual Conditions Management [RPT]: None reported or documented in medical history or problem list      Neurological Neurological Review of Symptoms: Not assessed    HEENT HEENT Symptoms Reported: Not assessed      Cardiovascular Cardiovascular Symptoms Reported: Not assessed    Respiratory Respiratory Symptoms Reported: Not assesed    Endocrine Endocrine Symptoms Reported: Not assessed    Gastrointestinal Gastrointestinal Symptoms Reported: Not assessed      Genitourinary Genitourinary Symptoms Reported: Not assessed    Integumentary Integumentary Symptoms Reported: Not assessed    Musculoskeletal Musculoskelatal Symptoms Reviewed: Not assessed        Psychosocial Psychosocial Symptoms Reported: Not assessed          02/09/2024    PHQ2-9 Depression Screening   Little interest or pleasure in doing things    Feeling down, depressed, or hopeless    PHQ-2 - Total Score    Trouble falling or staying asleep, or sleeping too much    Feeling tired or having little energy    Poor appetite or overeating     Feeling bad about yourself - or that you are a failure or have let yourself or your family down    Trouble concentrating on things, such as  reading the newspaper or watching television    Moving or speaking so slowly that other people could have noticed.  Or the opposite - being so fidgety or restless that you have been moving around a lot more than usual    Thoughts that you would be better off dead, or hurting yourself in some way    PHQ2-9 Total Score    If you checked off any problems, how difficult have these problems made it for you to do your work, take care of things at home, or get along with other people    Depression Interventions/Treatment      There were no vitals filed for this visit.    Medications Reviewed Today   Medications were not reviewed in this encounter     Recommendation:   PCP Follow-up Continue Current Plan of Care  Follow Up Plan:   Follow up by phone 02/22/24 at 3pm.  Cena Ligas, LCSW Clinical Social Worker [facility]

## 2024-02-22 ENCOUNTER — Other Ambulatory Visit: Payer: Self-pay | Admitting: Licensed Clinical Social Worker

## 2024-04-01 ENCOUNTER — Ambulatory Visit: Payer: Self-pay | Admitting: Nurse Practitioner
